# Patient Record
Sex: Male | Born: 1950 | Race: White | Hispanic: No | Marital: Married | Smoking: Current every day smoker
Health system: Southern US, Community
[De-identification: ages and names within clinical notes are randomized; demographics above are authoritative.]

## PROBLEM LIST (undated history)

## (undated) DIAGNOSIS — E785 Hyperlipidemia, unspecified: Secondary | ICD-10-CM

## (undated) DIAGNOSIS — I10 Essential (primary) hypertension: Secondary | ICD-10-CM

## (undated) HISTORY — PX: JOINT REPLACEMENT: SHX530

## (undated) HISTORY — PX: BACK SURGERY: SHX140

---

## 1999-10-09 ENCOUNTER — Encounter: Payer: Self-pay | Admitting: Orthopedic Surgery

## 1999-10-15 ENCOUNTER — Ambulatory Visit (HOSPITAL_COMMUNITY): Admission: RE | Admit: 1999-10-15 | Discharge: 1999-10-16 | Payer: Self-pay | Admitting: Orthopedic Surgery

## 1999-10-15 ENCOUNTER — Encounter: Payer: Self-pay | Admitting: Orthopedic Surgery

## 2000-05-28 ENCOUNTER — Encounter: Payer: Self-pay | Admitting: Orthopedic Surgery

## 2000-05-28 ENCOUNTER — Inpatient Hospital Stay (HOSPITAL_COMMUNITY): Admission: RE | Admit: 2000-05-28 | Discharge: 2000-05-31 | Payer: Self-pay | Admitting: Orthopedic Surgery

## 2002-10-31 ENCOUNTER — Ambulatory Visit (HOSPITAL_COMMUNITY): Admission: RE | Admit: 2002-10-31 | Discharge: 2002-10-31 | Payer: Self-pay | Admitting: Orthopedic Surgery

## 2002-10-31 ENCOUNTER — Encounter: Payer: Self-pay | Admitting: Orthopedic Surgery

## 2002-11-14 ENCOUNTER — Inpatient Hospital Stay (HOSPITAL_COMMUNITY): Admission: RE | Admit: 2002-11-14 | Discharge: 2002-11-17 | Payer: Self-pay | Admitting: Orthopedic Surgery

## 2002-11-14 ENCOUNTER — Encounter: Payer: Self-pay | Admitting: Orthopedic Surgery

## 2003-10-12 ENCOUNTER — Ambulatory Visit (HOSPITAL_COMMUNITY): Admission: RE | Admit: 2003-10-12 | Discharge: 2003-10-12 | Payer: Self-pay | Admitting: Gastroenterology

## 2016-11-08 ENCOUNTER — Inpatient Hospital Stay (HOSPITAL_COMMUNITY): Payer: Managed Care, Other (non HMO)

## 2016-11-08 ENCOUNTER — Inpatient Hospital Stay (HOSPITAL_COMMUNITY)
Admission: AD | Admit: 2016-11-08 | Discharge: 2016-11-14 | DRG: 480 | Disposition: A | Discharge status: Home Health | Payer: Managed Care, Other (non HMO) | Source: Other Acute Inpatient Hospital | Attending: Orthopedic Surgery | Admitting: Orthopedic Surgery

## 2016-11-08 ENCOUNTER — Encounter (HOSPITAL_COMMUNITY): Payer: Self-pay

## 2016-11-08 DIAGNOSIS — F17210 Nicotine dependence, cigarettes, uncomplicated: Secondary | ICD-10-CM | POA: Diagnosis present

## 2016-11-08 DIAGNOSIS — M9702XA Periprosthetic fracture around internal prosthetic left hip joint, initial encounter: Secondary | ICD-10-CM

## 2016-11-08 DIAGNOSIS — S72302A Unspecified fracture of shaft of left femur, initial encounter for closed fracture: Secondary | ICD-10-CM | POA: Diagnosis not present

## 2016-11-08 DIAGNOSIS — Z419 Encounter for procedure for purposes other than remedying health state, unspecified: Secondary | ICD-10-CM

## 2016-11-08 DIAGNOSIS — M978XXA Periprosthetic fracture around other internal prosthetic joint, initial encounter: Secondary | ICD-10-CM

## 2016-11-08 DIAGNOSIS — I10 Essential (primary) hypertension: Secondary | ICD-10-CM | POA: Diagnosis not present

## 2016-11-08 DIAGNOSIS — Z96649 Presence of unspecified artificial hip joint: Secondary | ICD-10-CM

## 2016-11-08 DIAGNOSIS — Z96642 Presence of left artificial hip joint: Secondary | ICD-10-CM | POA: Diagnosis present

## 2016-11-08 DIAGNOSIS — Y92008 Other place in unspecified non-institutional (private) residence as the place of occurrence of the external cause: Secondary | ICD-10-CM

## 2016-11-08 DIAGNOSIS — Z79899 Other long term (current) drug therapy: Secondary | ICD-10-CM | POA: Diagnosis not present

## 2016-11-08 DIAGNOSIS — Z7982 Long term (current) use of aspirin: Secondary | ICD-10-CM

## 2016-11-08 DIAGNOSIS — D62 Acute posthemorrhagic anemia: Secondary | ICD-10-CM | POA: Diagnosis not present

## 2016-11-08 DIAGNOSIS — M79652 Pain in left thigh: Secondary | ICD-10-CM | POA: Diagnosis present

## 2016-11-08 DIAGNOSIS — W010XXA Fall on same level from slipping, tripping and stumbling without subsequent striking against object, initial encounter: Secondary | ICD-10-CM | POA: Diagnosis not present

## 2016-11-08 DIAGNOSIS — M1710 Unilateral primary osteoarthritis, unspecified knee: Secondary | ICD-10-CM | POA: Diagnosis present

## 2016-11-08 HISTORY — DX: Essential (primary) hypertension: I10

## 2016-11-08 LAB — COMPREHENSIVE METABOLIC PANEL
ALBUMIN: 3.5 g/dL (ref 3.5–5.0)
ALT: 10 U/L — ABNORMAL LOW (ref 17–63)
AST: 24 U/L (ref 15–41)
Alkaline Phosphatase: 39 U/L (ref 38–126)
Anion gap: 8 (ref 5–15)
BILIRUBIN TOTAL: 0.8 mg/dL (ref 0.3–1.2)
BUN: 21 mg/dL — AB (ref 6–20)
CO2: 24 mmol/L (ref 22–32)
Calcium: 8.9 mg/dL (ref 8.9–10.3)
Chloride: 99 mmol/L — ABNORMAL LOW (ref 101–111)
Creatinine, Ser: 1.22 mg/dL (ref 0.61–1.24)
GFR calc Af Amer: >60 mL/min (ref >60)
GFR calc non Af Amer: >60 mL/min (ref >60)
GLUCOSE: 171 mg/dL — AB (ref 65–99)
POTASSIUM: 4.2 mmol/L (ref 3.5–5.1)
SODIUM: 131 mmol/L — AB (ref 135–145)
TOTAL PROTEIN: 6.2 g/dL — AB (ref 6.5–8.1)

## 2016-11-08 LAB — CBC
HEMATOCRIT: 35.9 % — AB (ref 39.0–52.0)
HEMOGLOBIN: 12.2 g/dL — AB (ref 13.0–17.0)
MCH: 32 pg (ref 26.0–34.0)
MCHC: 34 g/dL (ref 30.0–36.0)
MCV: 94.2 fL (ref 78.0–100.0)
Platelets: 207 10*3/uL (ref 150–400)
RBC: 3.81 MIL/uL — ABNORMAL LOW (ref 4.22–5.81)
RDW: 12.4 % (ref 11.5–15.5)
WBC: 13 10*3/uL — AB (ref 4.0–10.5)

## 2016-11-08 MED ORDER — OXYCODONE HCL 5 MG PO TABS
5.0000 mg | ORAL_TABLET | ORAL | Status: DC | PRN
  Administered 2016-11-08 – 2016-11-14 (×26): 10 mg via ORAL
  Filled 2016-11-08 (×28): qty 2

## 2016-11-08 MED ORDER — DOCUSATE SODIUM 100 MG PO CAPS
100.0000 mg | ORAL_CAPSULE | Freq: Two times a day (BID) | ORAL | Status: DC
  Administered 2016-11-08 – 2016-11-13 (×9): 100 mg via ORAL
  Filled 2016-11-08 (×10): qty 1

## 2016-11-08 MED ORDER — ONDANSETRON HCL 4 MG/2ML IJ SOLN: 4.0000 mg | Freq: Four times a day (QID) | INTRAMUSCULAR | Status: DC | PRN

## 2016-11-08 MED ORDER — HYDROCODONE-ACETAMINOPHEN 7.5-325 MG PO TABS
1.0000 | ORAL_TABLET | Freq: Four times a day (QID) | ORAL | Status: DC
  Administered 2016-11-08 – 2016-11-10 (×6): 1 via ORAL
  Filled 2016-11-08 (×6): qty 1

## 2016-11-08 MED ORDER — ZOLPIDEM TARTRATE 5 MG PO TABS
5.0000 mg | ORAL_TABLET | Freq: Every evening | ORAL | Status: DC | PRN
  Administered 2016-11-10 – 2016-11-13 (×3): 5 mg via ORAL
  Filled 2016-11-08 (×3): qty 1

## 2016-11-08 MED ORDER — FLEET ENEMA 7-19 GM/118ML RE ENEM: 1.0000 | ENEMA | Freq: Once | RECTAL | Status: DC | PRN

## 2016-11-08 MED ORDER — ONDANSETRON HCL 4 MG PO TABS: 4.0000 mg | ORAL_TABLET | Freq: Four times a day (QID) | ORAL | Status: DC | PRN

## 2016-11-08 MED ORDER — HYDROMORPHONE HCL 1 MG/ML IJ SOLN
1.0000 mg | INTRAMUSCULAR | Status: DC | PRN
  Filled 2016-11-08: qty 1

## 2016-11-08 MED ORDER — ACETAMINOPHEN 500 MG PO TABS
1000.0000 mg | ORAL_TABLET | Freq: Four times a day (QID) | ORAL | Status: DC
  Administered 2016-11-08 – 2016-11-09 (×4): 1000 mg via ORAL
  Filled 2016-11-08 (×4): qty 2

## 2016-11-08 MED ORDER — ACETAMINOPHEN 325 MG PO TABS: 650.0000 mg | ORAL_TABLET | Freq: Four times a day (QID) | ORAL | Status: DC | PRN

## 2016-11-08 MED ORDER — SENNOSIDES-DOCUSATE SODIUM 8.6-50 MG PO TABS: 1.0000 | ORAL_TABLET | Freq: Every evening | ORAL | Status: DC | PRN

## 2016-11-08 MED ORDER — DIPHENHYDRAMINE HCL 12.5 MG/5ML PO ELIX: 12.5000 mg | ORAL_SOLUTION | ORAL | Status: DC | PRN

## 2016-11-08 MED ORDER — METHOCARBAMOL 1000 MG/10ML IJ SOLN
500.0000 mg | Freq: Four times a day (QID) | INTRAVENOUS | Status: DC | PRN
  Filled 2016-11-08: qty 5

## 2016-11-08 MED ORDER — ACETAMINOPHEN 650 MG RE SUPP: 650.0000 mg | Freq: Four times a day (QID) | RECTAL | Status: DC | PRN

## 2016-11-08 MED ORDER — BISACODYL 5 MG PO TBEC: 5.0000 mg | DELAYED_RELEASE_TABLET | Freq: Every day | ORAL | Status: DC | PRN

## 2016-11-08 MED ORDER — METHOCARBAMOL 500 MG PO TABS
500.0000 mg | ORAL_TABLET | Freq: Four times a day (QID) | ORAL | Status: DC | PRN
  Administered 2016-11-08 – 2016-11-14 (×16): 500 mg via ORAL
  Filled 2016-11-08 (×18): qty 1

## 2016-11-09 ENCOUNTER — Inpatient Hospital Stay (HOSPITAL_COMMUNITY): Payer: Managed Care, Other (non HMO)

## 2016-11-09 LAB — SURGICAL PCR SCREEN
MRSA, PCR: NEGATIVE
STAPHYLOCOCCUS AUREUS: NEGATIVE

## 2016-11-09 LAB — HIV ANTIBODY (ROUTINE TESTING W REFLEX): HIV SCREEN 4TH GENERATION: NONREACTIVE

## 2016-11-09 MED ORDER — LISINOPRIL 20 MG PO TABS
20.0000 mg | ORAL_TABLET | Freq: Every day | ORAL | Status: DC
  Administered 2016-11-09 – 2016-11-10 (×2): 20 mg via ORAL
  Filled 2016-11-09 (×2): qty 1

## 2016-11-09 MED ORDER — HYDROCHLOROTHIAZIDE 25 MG PO TABS
25.0000 mg | ORAL_TABLET | Freq: Every day | ORAL | Status: DC
  Administered 2016-11-09 – 2016-11-10 (×2): 25 mg via ORAL
  Filled 2016-11-09 (×2): qty 1

## 2016-11-09 NOTE — H&P (Addendum)
PREOPERATIVE H&P  Chief Complaint: Left leg pain  HPI: Dennis Smith is a 66 y.o. male who was transferred last night for management of a periprosthetic left femur fracture. I believe he was seen at an urgent care, Dr. Valentina Gu received a call. He is a patient of mine, who has had a total hip replacement performed by Dr. Thurston Hole in 2004. He was doing very well as far as his leg goes, although he has knee arthritis as well. He was up on a roof doing some work and slipped on the wet roof, and fell, thankfully did not fall off the roof, but did fall onto his side. Acute onset severe pain unable to walk. Pain is better at rest, as well as with pain medications, worse with movement, located diffusely around the left thigh.  Past Medical History:  Diagnosis Date  . Hypertension    Past Surgical History:  Procedure Laterality Date  . BACK SURGERY    . JOINT REPLACEMENT     Social History   Social History  . Marital status: Married    Spouse name: N/A  . Number of children: N/A  . Years of education: N/A   Social History Main Topics  . Smoking status: Current Every Day Smoker    Packs/day: 1.00    Years: 40.00    Types: Cigarettes  . Smokeless tobacco: Never Used  . Alcohol use 4.8 oz/week    8 Glasses of wine per week  . Drug use: No  . Sexual activity: Yes   Other Topics Concern  . None   Social History Narrative  . None   No family history on file. No Known Allergies Prior to Admission medications   Medication Sig Start Date End Date Taking? Authorizing Provider  amLODipine (NORVASC) 10 MG tablet Take 10 mg by mouth at bedtime. 09/15/16  Yes [provider]  Ascorbic Acid (VITAMIN C PO) Take 1 tablet by mouth every other day.   Yes [provider]  aspirin EC 81 MG tablet Take 81 mg by mouth at bedtime.   Yes [provider]  cetirizine (ZYRTEC) 10 MG tablet Take 10 mg by mouth daily.   Yes [provider]  CINNAMON PO Take 1 capsule by mouth  daily.   Yes [provider]  Cyanocobalamin (VITAMIN B-12 PO) Take 1 tablet by mouth daily.   Yes [provider]  hydrochlorothiazide (HYDRODIURIL) 25 MG tablet Take 25 mg by mouth every morning.  09/15/16  Yes [provider]  ibuprofen (ADVIL,MOTRIN) 200 MG tablet Take 800 mg by mouth every morning.   Yes [provider]  lisinopril (PRINIVIL,ZESTRIL) 20 MG tablet Take 20 mg by mouth every morning.  09/15/16  Yes [provider]  Omega-3 Fatty Acids (FISH OIL PO) Take 1 capsule by mouth daily with supper.   Yes [provider]  omeprazole (PRILOSEC) 20 MG capsule Take 20 mg by mouth daily before breakfast. 10/17/15  Yes [provider]  TURMERIC PO Take 1 capsule by mouth daily.   Yes [provider]     Positive ROS: All other systems have been reviewed and were otherwise negative with the exception of those mentioned in the HPI and as above.  Physical Exam: General: Alert, no acute distress Cardiovascular: No pedal edema Respiratory: No cyanosis, no use of accessory musculature GI: No organomegaly, abdomen is soft and non-tender Skin: No lesions in the area of chief complaint Neurologic: Sensation intact distally Psychiatric: Patient is competent  for consent with normal mood and affect Lymphatic: No axillary or cervical lymphadenopathy  MUSCULOSKELETAL: Left leg has bruising, positive pain to palpation diffusely around the left thigh, EHL and FHL are intact. I did not assess leg lengths, as I did not fully straighten out his leg.  Assessment: Left hip periprosthetic femur fracture   Plan: Plan for Procedure(s): This is an acute severe injury, and is complicated and requires the attention of a total joint subspecialists. I have consulted Dr. Turner Danielsowan, who is going to plan for open reduction internal fixation versus long stem revision replacement of the left femoral component, the plan is for Wednesday.  He will be  admitted, NWB, with IV pain control and perioperative optimization in the meantime.  I'll also make sure that he has a type and cross available for surgical intervention. I have discussed this with the patient, and the potential for blood transfusion.  Eulas PostLANDAU,Allene Furuya P, MD Cell [phone]   11/09/2016 3:14 PM

## 2016-11-10 LAB — PREPARE RBC (CROSSMATCH)

## 2016-11-10 LAB — ABO/RH: ABO/RH(D): B POS

## 2016-11-10 MED ORDER — POVIDONE-IODINE 10 % EX SWAB: 2.0000 "application " | Freq: Once | CUTANEOUS | Status: DC

## 2016-11-10 MED ORDER — TRANEXAMIC ACID 1000 MG/10ML IV SOLN
1000.0000 mg | INTRAVENOUS | Status: AC
  Administered 2016-11-11: 1000 mg via INTRAVENOUS
  Filled 2016-11-10: qty 1100

## 2016-11-10 MED ORDER — TRANEXAMIC ACID 1000 MG/10ML IV SOLN
2000.0000 mg | INTRAVENOUS | Status: DC
  Filled 2016-11-10 (×2): qty 20

## 2016-11-10 MED ORDER — CEFAZOLIN SODIUM-DEXTROSE 2-4 GM/100ML-% IV SOLN: 2.0000 g | INTRAVENOUS | Status: DC

## 2016-11-10 MED ORDER — CEFAZOLIN SODIUM-DEXTROSE 2-4 GM/100ML-% IV SOLN
2.0000 g | INTRAVENOUS | Status: AC
  Administered 2016-11-11: 2 g via INTRAVENOUS
  Filled 2016-11-10 (×2): qty 100

## 2016-11-10 MED ORDER — TRANEXAMIC ACID 1000 MG/10ML IV SOLN
2000.0000 mg | INTRAVENOUS | Status: DC
  Filled 2016-11-10: qty 20

## 2016-11-10 MED ORDER — BUPIVACAINE LIPOSOME 1.3 % IJ SUSP: 20.0000 mL | Freq: Once | INTRAMUSCULAR | Status: DC

## 2016-11-10 MED ORDER — TRANEXAMIC ACID 1000 MG/10ML IV SOLN: 2000.0000 mg | Freq: Once | INTRAVENOUS | Status: DC

## 2016-11-10 MED ORDER — CHLORHEXIDINE GLUCONATE 4 % EX LIQD: 60.0000 mL | Freq: Once | CUTANEOUS | Status: AC

## 2016-11-10 MED ORDER — CHLORHEXIDINE GLUCONATE 4 % EX LIQD
60.0000 mL | Freq: Once | CUTANEOUS | Status: AC
  Administered 2016-11-10: 4 via TOPICAL
  Filled 2016-11-10: qty 60
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
## 2016-11-11 ENCOUNTER — Encounter (HOSPITAL_COMMUNITY): Admission: AD | Disposition: A | Discharge status: Home Health | Payer: Self-pay | Attending: Orthopedic Surgery

## 2016-11-11 ENCOUNTER — Inpatient Hospital Stay (HOSPITAL_COMMUNITY): Payer: Managed Care, Other (non HMO)

## 2016-11-11 ENCOUNTER — Encounter (HOSPITAL_COMMUNITY): Payer: Self-pay | Admitting: *Deleted

## 2016-11-11 ENCOUNTER — Inpatient Hospital Stay (HOSPITAL_COMMUNITY): Payer: Managed Care, Other (non HMO) | Admitting: Anesthesiology

## 2016-11-11 HISTORY — PX: ORIF FEMUR FRACTURE: SHX2119

## 2016-11-11 SURGERY — OPEN REDUCTION INTERNAL FIXATION (ORIF) DISTAL FEMUR FRACTURE
Anesthesia: General | Site: Hip | Laterality: Left

## 2016-11-11 MED ORDER — ONDANSETRON HCL 4 MG/2ML IJ SOLN: 4.0000 mg | Freq: Once | INTRAMUSCULAR | Status: DC | PRN

## 2016-11-11 MED ORDER — ROCURONIUM BROMIDE 50 MG/5ML IV SOLN
INTRAVENOUS | Status: AC
  Filled 2016-11-11: qty 2

## 2016-11-11 MED ORDER — PROPOFOL 10 MG/ML IV BOLUS
INTRAVENOUS | Status: AC
  Filled 2016-11-11: qty 40

## 2016-11-11 MED ORDER — MEPERIDINE HCL 25 MG/ML IJ SOLN: 6.2500 mg | INTRAMUSCULAR | Status: DC | PRN

## 2016-11-11 MED ORDER — FENTANYL CITRATE (PF) 100 MCG/2ML IJ SOLN: 25.0000 ug | INTRAMUSCULAR | Status: DC | PRN

## 2016-11-11 MED ORDER — SODIUM CHLORIDE 0.9 % IR SOLN
Status: DC | PRN
  Administered 2016-11-11 (×2): 1000 mL

## 2016-11-11 MED ORDER — ONDANSETRON HCL 4 MG/2ML IJ SOLN: 4.0000 mg | Freq: Four times a day (QID) | INTRAMUSCULAR | Status: DC | PRN

## 2016-11-11 MED ORDER — FENTANYL CITRATE (PF) 250 MCG/5ML IJ SOLN
INTRAMUSCULAR | Status: AC
  Filled 2016-11-11: qty 5

## 2016-11-11 MED ORDER — PHENOL 1.4 % MT LIQD: 1.0000 | OROMUCOSAL | Status: DC | PRN

## 2016-11-11 MED ORDER — OXYCODONE-ACETAMINOPHEN 5-325 MG PO TABS: 1.0000 | ORAL_TABLET | ORAL | 0 refills | Status: DC | PRN

## 2016-11-11 MED ORDER — DEXAMETHASONE SODIUM PHOSPHATE 10 MG/ML IJ SOLN
INTRAMUSCULAR | Status: DC | PRN
  Administered 2016-11-11: 5 mg via INTRAVENOUS

## 2016-11-11 MED ORDER — EPHEDRINE 5 MG/ML INJ
INTRAVENOUS | Status: AC
  Filled 2016-11-11: qty 20

## 2016-11-11 MED ORDER — AMLODIPINE BESYLATE 10 MG PO TABS
10.0000 mg | ORAL_TABLET | Freq: Every day | ORAL | Status: DC
  Administered 2016-11-11 – 2016-11-12 (×2): 10 mg via ORAL
  Filled 2016-11-11 (×3): qty 1

## 2016-11-11 MED ORDER — LABETALOL HCL 5 MG/ML IV SOLN
INTRAVENOUS | Status: AC
  Administered 2016-11-11: 10 mg via INTRAVENOUS
  Filled 2016-11-11: qty 4

## 2016-11-11 MED ORDER — PHENYLEPHRINE 40 MCG/ML (10ML) SYRINGE FOR IV PUSH (FOR BLOOD PRESSURE SUPPORT)
PREFILLED_SYRINGE | INTRAVENOUS | Status: AC
  Filled 2016-11-11: qty 20

## 2016-11-11 MED ORDER — LORATADINE 10 MG PO TABS
10.0000 mg | ORAL_TABLET | Freq: Every day | ORAL | Status: DC
  Administered 2016-11-12 – 2016-11-14 (×3): 10 mg via ORAL
  Filled 2016-11-11 (×3): qty 1

## 2016-11-11 MED ORDER — DEXAMETHASONE SODIUM PHOSPHATE 10 MG/ML IJ SOLN
INTRAMUSCULAR | Status: AC
  Filled 2016-11-11: qty 1

## 2016-11-11 MED ORDER — FENTANYL CITRATE (PF) 100 MCG/2ML IJ SOLN
INTRAMUSCULAR | Status: AC
  Administered 2016-11-11: 50 ug via INTRAVENOUS
  Filled 2016-11-11: qty 2

## 2016-11-11 MED ORDER — METHOCARBAMOL 500 MG PO TABS: 500.0000 mg | ORAL_TABLET | Freq: Two times a day (BID) | ORAL | 0 refills | Status: DC

## 2016-11-11 MED ORDER — LISINOPRIL 20 MG PO TABS
20.0000 mg | ORAL_TABLET | Freq: Every morning | ORAL | Status: DC
  Administered 2016-11-12 – 2016-11-13 (×2): 20 mg via ORAL
  Filled 2016-11-11 (×2): qty 1

## 2016-11-11 MED ORDER — LACTATED RINGERS IV SOLN
INTRAVENOUS | Status: DC
  Administered 2016-11-11 (×2): via INTRAVENOUS

## 2016-11-11 MED ORDER — FENTANYL CITRATE (PF) 100 MCG/2ML IJ SOLN
INTRAMUSCULAR | Status: DC | PRN
  Administered 2016-11-11 (×6): 50 ug via INTRAVENOUS
  Administered 2016-11-11: 100 ug via INTRAVENOUS
  Administered 2016-11-11 (×7): 50 ug via INTRAVENOUS

## 2016-11-11 MED ORDER — PROPOFOL 10 MG/ML IV BOLUS
INTRAVENOUS | Status: DC | PRN
  Administered 2016-11-11 (×2): 20 mg via INTRAVENOUS
  Administered 2016-11-11: 160 mg via INTRAVENOUS

## 2016-11-11 MED ORDER — BUPIVACAINE-EPINEPHRINE 0.5% -1:200000 IJ SOLN
INTRAMUSCULAR | Status: DC | PRN
  Administered 2016-11-11: 30 mL

## 2016-11-11 MED ORDER — HYDROCHLOROTHIAZIDE 25 MG PO TABS
25.0000 mg | ORAL_TABLET | Freq: Every morning | ORAL | Status: DC
  Administered 2016-11-12 – 2016-11-13 (×2): 25 mg via ORAL
  Filled 2016-11-11 (×2): qty 1

## 2016-11-11 MED ORDER — PANTOPRAZOLE SODIUM 40 MG PO TBEC
40.0000 mg | DELAYED_RELEASE_TABLET | Freq: Every day | ORAL | Status: DC
  Administered 2016-11-11 – 2016-11-14 (×4): 40 mg via ORAL
  Filled 2016-11-11 (×4): qty 1

## 2016-11-11 MED ORDER — ONDANSETRON HCL 4 MG PO TABS: 4.0000 mg | ORAL_TABLET | Freq: Four times a day (QID) | ORAL | Status: DC | PRN

## 2016-11-11 MED ORDER — ONDANSETRON HCL 4 MG/2ML IJ SOLN
INTRAMUSCULAR | Status: AC
  Filled 2016-11-11: qty 4

## 2016-11-11 MED ORDER — PHENYLEPHRINE 40 MCG/ML (10ML) SYRINGE FOR IV PUSH (FOR BLOOD PRESSURE SUPPORT)
PREFILLED_SYRINGE | INTRAVENOUS | Status: DC | PRN
  Administered 2016-11-11 (×2): 80 ug via INTRAVENOUS
  Administered 2016-11-11: 120 ug via INTRAVENOUS

## 2016-11-11 MED ORDER — TRANEXAMIC ACID 1000 MG/10ML IV SOLN
INTRAVENOUS | Status: AC | PRN
  Administered 2016-11-11: 2000 mg via INTRAVENOUS

## 2016-11-11 MED ORDER — ROCURONIUM BROMIDE 100 MG/10ML IV SOLN
INTRAVENOUS | Status: DC | PRN
  Administered 2016-11-11: 50 mg via INTRAVENOUS

## 2016-11-11 MED ORDER — FENTANYL CITRATE (PF) 100 MCG/2ML IJ SOLN
25.0000 ug | INTRAMUSCULAR | Status: DC | PRN
  Administered 2016-11-11 (×2): 50 ug via INTRAVENOUS

## 2016-11-11 MED ORDER — MIDAZOLAM HCL 2 MG/2ML IJ SOLN
INTRAMUSCULAR | Status: AC
  Filled 2016-11-11: qty 2

## 2016-11-11 MED ORDER — METOCLOPRAMIDE HCL 5 MG PO TABS: 5.0000 mg | ORAL_TABLET | Freq: Three times a day (TID) | ORAL | Status: DC | PRN

## 2016-11-11 MED ORDER — LABETALOL HCL 5 MG/ML IV SOLN
10.0000 mg | INTRAVENOUS | Status: AC | PRN
  Administered 2016-11-11 (×2): 10 mg via INTRAVENOUS

## 2016-11-11 MED ORDER — ASPIRIN EC 325 MG PO TBEC: 325.0000 mg | DELAYED_RELEASE_TABLET | Freq: Two times a day (BID) | ORAL | 0 refills | Status: DC

## 2016-11-11 MED ORDER — METOCLOPRAMIDE HCL 5 MG/ML IJ SOLN: 5.0000 mg | Freq: Three times a day (TID) | INTRAMUSCULAR | Status: DC | PRN

## 2016-11-11 MED ORDER — ONDANSETRON HCL 4 MG/2ML IJ SOLN
INTRAMUSCULAR | Status: DC | PRN
  Administered 2016-11-11: 4 mg via INTRAVENOUS

## 2016-11-11 MED ORDER — MENTHOL 3 MG MT LOZG: 1.0000 | LOZENGE | OROMUCOSAL | Status: DC | PRN

## 2016-11-11 MED ORDER — ACETAMINOPHEN 325 MG PO TABS
650.0000 mg | ORAL_TABLET | Freq: Four times a day (QID) | ORAL | Status: DC | PRN
  Administered 2016-11-12 – 2016-11-14 (×4): 650 mg via ORAL
  Filled 2016-11-11 (×4): qty 2

## 2016-11-11 MED ORDER — LIDOCAINE HCL (CARDIAC) 20 MG/ML IV SOLN
INTRAVENOUS | Status: DC | PRN
  Administered 2016-11-11: 60 mg via INTRAVENOUS

## 2016-11-11 MED ORDER — LIDOCAINE HCL (CARDIAC) 20 MG/ML IV SOLN
INTRAVENOUS | Status: AC
  Filled 2016-11-11: qty 10

## 2016-11-11 MED ORDER — ASPIRIN EC 325 MG PO TBEC
325.0000 mg | DELAYED_RELEASE_TABLET | Freq: Two times a day (BID) | ORAL | Status: DC
  Administered 2016-11-12 – 2016-11-14 (×5): 325 mg via ORAL
  Filled 2016-11-11 (×5): qty 1

## 2016-11-11 MED ORDER — KCL IN DEXTROSE-NACL 20-5-0.45 MEQ/L-%-% IV SOLN
INTRAVENOUS | Status: DC
  Administered 2016-11-11: 22:00:00 via INTRAVENOUS
  Filled 2016-11-11: qty 1000

## 2016-11-11 MED ORDER — ACETAMINOPHEN 650 MG RE SUPP: 650.0000 mg | Freq: Four times a day (QID) | RECTAL | Status: DC | PRN

## 2016-11-11 MED ORDER — BUPIVACAINE LIPOSOME 1.3 % IJ SUSP
20.0000 mL | INTRAMUSCULAR | Status: AC
  Administered 2016-11-11: 20 mL
  Filled 2016-11-11: qty 20

## 2016-11-11 MED ORDER — ONDANSETRON HCL 4 MG/2ML IJ SOLN
INTRAMUSCULAR | Status: AC
  Filled 2016-11-11: qty 2

## 2016-11-11 MED ORDER — MIDAZOLAM HCL 5 MG/5ML IJ SOLN
INTRAMUSCULAR | Status: DC | PRN
  Administered 2016-11-11: 2 mg via INTRAVENOUS

## 2016-11-11 MED ORDER — SODIUM CHLORIDE 0.9 % IJ SOLN
INTRAMUSCULAR | Status: DC | PRN
  Administered 2016-11-11: 50 mL via INTRAVENOUS

## 2016-11-11 MED ORDER — BUPIVACAINE HCL (PF) 0.25 % IJ SOLN
INTRAMUSCULAR | Status: AC
  Filled 2016-11-11: qty 30

## 2016-11-11 SURGICAL SUPPLY — 99 items
BAG DECANTER FOR FLEXI CONT (MISCELLANEOUS) ×3 IMPLANT
BIT DRILL 4.3 (BIT) ×2 IMPLANT
BLADE SAW SAG 73X25 THK (BLADE) ×1
BLADE SAW SGTL 73X25 THK (BLADE) ×2 IMPLANT
BLADE SURG 10 STRL SS (BLADE) ×6 IMPLANT
BNDG COHESIVE 4X5 TAN STRL (GAUZE/BANDAGES/DRESSINGS) ×3 IMPLANT
BRUSH FEMORAL CANAL (MISCELLANEOUS) IMPLANT
CABLE CERLAGE W/CRIMP 1.8 (Cable) ×12 IMPLANT
CAP LOCK NCB (Cap) ×6 IMPLANT
COVER SURGICAL LIGHT HANDLE (MISCELLANEOUS) ×3 IMPLANT
CUFF TOURNIQUET SINGLE 34IN LL (TOURNIQUET CUFF) IMPLANT
CUFF TOURNIQUET SINGLE 44IN (TOURNIQUET CUFF) IMPLANT
DRAPE C-ARM 42X72 X-RAY (DRAPES) ×3 IMPLANT
DRAPE C-ARMOR (DRAPES) ×3 IMPLANT
DRAPE HALF SHEET 40X57 (DRAPES) ×3 IMPLANT
DRAPE IMP U-DRAPE 54X76 (DRAPES) ×3 IMPLANT
DRAPE ORTHO SPLIT 77X108 STRL (DRAPES) ×6
DRAPE SURG 17X23 STRL (DRAPES) IMPLANT
DRAPE SURG ORHT 6 SPLT 77X108 (DRAPES) ×4 IMPLANT
DRAPE U-SHAPE 47X51 STRL (DRAPES) ×3 IMPLANT
DRILL BIT 4.3 (BIT) ×2
DRILL BIT 7/64X5 (BIT) ×3 IMPLANT
DRSG ADAPTIC 3X8 NADH LF (GAUZE/BANDAGES/DRESSINGS) IMPLANT
DRSG AQUACEL AG ADV 3.5X 6 (GAUZE/BANDAGES/DRESSINGS) ×3 IMPLANT
DRSG AQUACEL AG ADV 3.5X10 (GAUZE/BANDAGES/DRESSINGS) IMPLANT
DRSG AQUACEL AG ADV 3.5X14 (GAUZE/BANDAGES/DRESSINGS) ×3 IMPLANT
DRSG PAD ABDOMINAL 8X10 ST (GAUZE/BANDAGES/DRESSINGS) IMPLANT
DURAPREP 26ML APPLICATOR (WOUND CARE) ×3 IMPLANT
ELECT BLADE 4.0 EZ CLEAN MEGAD (MISCELLANEOUS) ×3
ELECT BLADE 6.5 EXT (BLADE) IMPLANT
ELECT CAUTERY BLADE 6.4 (BLADE) ×3 IMPLANT
ELECT REM PT RETURN 9FT ADLT (ELECTROSURGICAL) ×3
ELECTRODE BLDE 4.0 EZ CLN MEGD (MISCELLANEOUS) ×2 IMPLANT
ELECTRODE REM PT RTRN 9FT ADLT (ELECTROSURGICAL) ×2 IMPLANT
EVACUATOR 1/8 PVC DRAIN (DRAIN) IMPLANT
GAUZE SPONGE 4X4 12PLY STRL (GAUZE/BANDAGES/DRESSINGS) IMPLANT
GAUZE XEROFORM 5X9 LF (GAUZE/BANDAGES/DRESSINGS) IMPLANT
GLOVE BIO SURGEON STRL SZ 6.5 (GLOVE) ×3 IMPLANT
GLOVE BIO SURGEON STRL SZ7.5 (GLOVE) ×3 IMPLANT
GLOVE BIO SURGEON STRL SZ8.5 (GLOVE) ×6 IMPLANT
GLOVE BIOGEL PI IND STRL 8 (GLOVE) ×4 IMPLANT
GLOVE BIOGEL PI IND STRL 9 (GLOVE) ×2 IMPLANT
GLOVE BIOGEL PI INDICATOR 8 (GLOVE) ×2
GLOVE BIOGEL PI INDICATOR 9 (GLOVE) ×1
GLOVE SURG SS PI 6.5 STRL IVOR (GLOVE) ×3 IMPLANT
GOWN STRL REUS W/ TWL LRG LVL3 (GOWN DISPOSABLE) ×6 IMPLANT
GOWN STRL REUS W/ TWL XL LVL3 (GOWN DISPOSABLE) ×4 IMPLANT
GOWN STRL REUS W/TWL LRG LVL3 (GOWN DISPOSABLE) ×6
GOWN STRL REUS W/TWL XL LVL3 (GOWN DISPOSABLE) ×4
HANDPIECE INTERPULSE COAX TIP (DISPOSABLE)
HOOD PEEL AWAY FACE SHEILD DIS (HOOD) ×6 IMPLANT
KIT BASIN OR (CUSTOM PROCEDURE TRAY) ×3 IMPLANT
KIT ROOM TURNOVER OR (KITS) ×3 IMPLANT
LOCKPLATE CABLE BUTTON NCP HIP (Orthopedic Implant) ×12 IMPLANT
MANIFOLD NEPTUNE II (INSTRUMENTS) ×3 IMPLANT
NEEDLE 22X1 1/2 (OR ONLY) (NEEDLE) ×6 IMPLANT
NS IRRIG 1000ML POUR BTL (IV SOLUTION) ×6 IMPLANT
PACK GENERAL/GYN (CUSTOM PROCEDURE TRAY) IMPLANT
PACK TOTAL JOINT (CUSTOM PROCEDURE TRAY) ×3 IMPLANT
PACK UNIVERSAL I (CUSTOM PROCEDURE TRAY) IMPLANT
PAD ARMBOARD 7.5X6 YLW CONV (MISCELLANEOUS) ×6 IMPLANT
PASSER SUT SWANSON 36MM LOOP (INSTRUMENTS) IMPLANT
PLATE LT PROX FEMUR 12H (Plate) ×3 IMPLANT
PRESSURIZER FEMORAL UNIV (MISCELLANEOUS) IMPLANT
SCREW CORTICAL NCB 5.0X40 (Screw) ×3 IMPLANT
SCREW NCB 5.0X36MM (Screw) ×9 IMPLANT
SCREW NCB 5.0X46MM (Screw) ×3 IMPLANT
SCREW UNICORT 5.0X18MM (Screw) ×3 IMPLANT
SCREW UNICORTICAL 5.0X18 (Screw) ×2 IMPLANT
SET HNDPC FAN SPRY TIP SCT (DISPOSABLE) IMPLANT
SLEEVE SURGEON STRL (DRAPES) IMPLANT
SPONGE LAP 18X18 X RAY DECT (DISPOSABLE) IMPLANT
STAPLER VISISTAT 35W (STAPLE) ×3 IMPLANT
STOCKINETTE IMPERVIOUS 9X36 MD (GAUZE/BANDAGES/DRESSINGS) ×3 IMPLANT
SUCTION FRAZIER HANDLE 10FR (MISCELLANEOUS) ×1
SUCTION TUBE FRAZIER 10FR DISP (MISCELLANEOUS) ×2 IMPLANT
SUT ETHIBOND 2 V 37 (SUTURE) IMPLANT
SUT VIC AB 0 CT1 27 (SUTURE) ×4
SUT VIC AB 0 CT1 27XBRD ANBCTR (SUTURE) ×4 IMPLANT
SUT VIC AB 0 CTX 36 (SUTURE)
SUT VIC AB 0 CTX36XBRD ANTBCTR (SUTURE) IMPLANT
SUT VIC AB 1 CT1 27 (SUTURE) ×4
SUT VIC AB 1 CT1 27XBRD ANBCTR (SUTURE) ×4 IMPLANT
SUT VIC AB 1 CTX 36 (SUTURE) ×3
SUT VIC AB 1 CTX36XBRD ANBCTR (SUTURE) ×2 IMPLANT
SUT VIC AB 2-0 CT1 27 (SUTURE) ×2
SUT VIC AB 2-0 CT1 TAPERPNT 27 (SUTURE) ×2 IMPLANT
SUT VIC AB 3-0 CT1 27 (SUTURE) ×4
SUT VIC AB 3-0 CT1 TAPERPNT 27 (SUTURE) ×4 IMPLANT
SWAB COLLECTION DEVICE MRSA (MISCELLANEOUS) IMPLANT
SWAB CULTURE ESWAB REG 1ML (MISCELLANEOUS) IMPLANT
SYR 20ML ECCENTRIC (SYRINGE) IMPLANT
SYR 50ML LL SCALE MARK (SYRINGE) ×3 IMPLANT
SYR CONTROL 10ML LL (SYRINGE) ×6 IMPLANT
TOWEL OR 17X24 6PK STRL BLUE (TOWEL DISPOSABLE) ×3 IMPLANT
TOWEL OR 17X26 10 PK STRL BLUE (TOWEL DISPOSABLE) ×3 IMPLANT
TOWER CARTRIDGE SMART MIX (DISPOSABLE) IMPLANT
TRAY FOLEY CATH SILVER 14FR (SET/KITS/TRAYS/PACK) ×3 IMPLANT
WATER STERILE IRR 1000ML POUR (IV SOLUTION) IMPLANT

## 2016-11-11 NOTE — Discharge Instructions (Signed)

## 2016-11-11 NOTE — Anesthesia Postprocedure Evaluation (Signed)
Anesthesia Post Note  Patient: Dennis CampionJohn B Smith  Procedure(s) Performed: Procedure(s) (LRB): OPEN REDUCTION INTERNAL FIXATION (ORIF) LEFT FEMUR FRACTURE AND ARTHOTOMY (Left)     Patient location during evaluation: PACU Anesthesia Type: General Level of consciousness: awake and alert Pain management: pain level controlled Vital Signs Assessment: post-procedure vital signs reviewed and stable Respiratory status: spontaneous breathing, nonlabored ventilation and respiratory function stable Cardiovascular status: blood pressure returned to baseline and stable Postop Assessment: no signs of nausea or vomiting Anesthetic complications: no    Last Vitals:  Vitals:   11/11/16 1825 11/11/16 1843  BP: (!) 185/83 (!) 174/81  Pulse: 93 90  Resp: 10 15  Temp:      Last Pain:  Vitals:   11/11/16 1831  TempSrc:   PainSc: 6                  Breckin Savannah,W. EDMOND

## 2016-11-11 NOTE — Anesthesia Preprocedure Evaluation (Addendum)
Anesthesia Evaluation  Patient identified by MRN, date of birth, ID band Patient awake    Reviewed: Allergy & Precautions, NPO status , Patient's Chart, lab work & pertinent test results  Airway Mallampati: I  TM Distance: >3 FB Neck ROM: Full    Dental no notable dental hx.    Pulmonary neg pulmonary ROS, Current Smoker,    Pulmonary exam normal breath sounds clear to auscultation       Cardiovascular hypertension, negative cardio ROS Normal cardiovascular exam Rhythm:Regular Rate:Normal     Neuro/Psych negative neurological ROS  negative psych ROS   GI/Hepatic negative GI ROS, Neg liver ROS,   Endo/Other  negative endocrine ROS  Renal/GU negative Renal ROS  negative genitourinary   Musculoskeletal negative musculoskeletal ROS (+)   Abdominal   Peds negative pediatric ROS (+)  Hematology negative hematology ROS (+)   Anesthesia Other Findings EKG  Normal sinus rhythm Nonspecific T wave abnormality  Reproductive/Obstetrics negative OB ROS                                                             Anesthesia Evaluation  Patient identified by MRN, date of birth, ID band Patient awake    Reviewed: Allergy & Precautions, NPO status , Patient's Chart, lab work & pertinent test results  Airway Mallampati: II  TM Distance: >3 FB Neck ROM: Full    Dental no notable dental hx.    Pulmonary neg pulmonary ROS, Current Smoker,    Pulmonary exam normal breath sounds clear to auscultation       Cardiovascular hypertension, negative cardio ROS Normal cardiovascular exam Rhythm:Regular Rate:Normal     Neuro/Psych negative neurological ROS  negative psych ROS   GI/Hepatic negative GI ROS, Neg liver ROS,   Endo/Other  negative endocrine ROS  Renal/GU negative Renal ROS  negative genitourinary   Musculoskeletal negative musculoskeletal ROS (+)   Abdominal    Peds negative pediatric ROS (+)  Hematology negative hematology ROS (+)   Anesthesia Other Findings EKG  Normal sinus rhythm Nonspecific T wave abnormality  Reproductive/Obstetrics negative OB ROS                             Anesthesia Physical Anesthesia Plan  ASA: II  Anesthesia Plan: General   Post-op Pain Management:    Induction: Intravenous  PONV Risk Score and Plan: 2 and Ondansetron, Dexamethasone and Treatment may vary due to age or medical condition  Airway Management Planned: LMA  Additional Equipment:   Intra-op Plan:   Post-operative Plan:   Informed Consent:   Dental advisory given  Plan Discussed with: CRNA and Surgeon  Anesthesia Plan Comments: ( )        Anesthesia Quick Evaluation  Anesthesia Physical Anesthesia Plan  ASA: II  Anesthesia Plan: General   Post-op Pain Management:    Induction: Intravenous  PONV Risk Score and Plan: 2 and Ondansetron, Dexamethasone and Treatment may vary due to age or medical condition  Airway Management Planned: LMA  Additional Equipment:   Intra-op Plan:   Post-operative Plan:   Informed Consent:   Dental advisory given  Plan Discussed with: CRNA and Surgeon  Anesthesia Plan Comments: ( )        Anesthesia Quick Evaluation

## 2016-11-11 NOTE — Transfer of Care (Signed)
Immediate Anesthesia Transfer of Care Note  Patient: Dennis CampionJohn B Smith  Procedure(s) Performed: Procedure(s): OPEN REDUCTION INTERNAL FIXATION (ORIF) LEFT FEMUR FRACTURE AND ARTHOTOMY (Left)  Patient Location: PACU  Anesthesia Type:General  Level of Consciousness: awake, alert , oriented and patient cooperative  Airway & Oxygen Therapy: Patient Spontanous Breathing  Post-op Assessment: Report given to RN and Post -op Vital signs reviewed and stable  Post vital signs: Reviewed and stable  Last Vitals:  Vitals:   11/11/16 0432 11/11/16 1757  BP: 126/61   Pulse: 80   Resp: 18   Temp: 37 C (P) 36.5 C    Last Pain:  Vitals:   11/11/16 0539  TempSrc:   PainSc: Asleep      Patients Stated Pain Goal: 0 (11/11/16 0053)  Complications: No apparent anesthesia complications
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
## 2016-11-11 NOTE — Anesthesia Procedure Notes (Signed)
Procedure Name: Intubation Date/Time: 11/11/2016 2:43 PM Performed by: Melina Copa, Pranish Akhavan R Pre-anesthesia Checklist: Patient identified, Emergency Drugs available, Suction available and Patient being monitored Patient Re-evaluated:Patient Re-evaluated prior to induction Oxygen Delivery Method: Circle System Utilized Preoxygenation: Pre-oxygenation with 100% oxygen Induction Type: IV induction Ventilation: Mask ventilation without difficulty Laryngoscope Size: Mac and 4 Grade View: Grade I Tube type: Oral Tube size: 7.5 mm Number of attempts: 1 Airway Equipment and Method: Stylet Placement Confirmation: ETT inserted through vocal cords under direct vision,  positive ETCO2 and breath sounds checked- equal and bilateral Secured at: 21 cm Tube secured with: Tape Dental Injury: Teeth and Oropharynx as per pre-operative assessment

## 2016-11-12 ENCOUNTER — Encounter (HOSPITAL_COMMUNITY): Payer: Self-pay | Admitting: Orthopedic Surgery

## 2016-11-12 LAB — BASIC METABOLIC PANEL
ANION GAP: 6 (ref 5–15)
BUN: 21 mg/dL — AB (ref 6–20)
CHLORIDE: 96 mmol/L — AB (ref 101–111)
CO2: 28 mmol/L (ref 22–32)
Calcium: 8.6 mg/dL — ABNORMAL LOW (ref 8.9–10.3)
Creatinine, Ser: 0.96 mg/dL (ref 0.61–1.24)
GFR calc Af Amer: >60 mL/min (ref >60)
GLUCOSE: 124 mg/dL — AB (ref 65–99)
POTASSIUM: 4.1 mmol/L (ref 3.5–5.1)
Sodium: 130 mmol/L — ABNORMAL LOW (ref 135–145)

## 2016-11-12 LAB — CBC
HEMATOCRIT: 28.3 % — AB (ref 39.0–52.0)
Hemoglobin: 9.4 g/dL — ABNORMAL LOW (ref 13.0–17.0)
MCH: 31.5 pg (ref 26.0–34.0)
MCHC: 33.2 g/dL (ref 30.0–36.0)
MCV: 95 fL (ref 78.0–100.0)
Platelets: 212 10*3/uL (ref 150–400)
RBC: 2.98 MIL/uL — AB (ref 4.22–5.81)
RDW: 12.6 % (ref 11.5–15.5)
WBC: 12.6 10*3/uL — AB (ref 4.0–10.5)

## 2016-11-12 MED ORDER — GABAPENTIN 600 MG PO TABS
300.0000 mg | ORAL_TABLET | Freq: Three times a day (TID) | ORAL | Status: DC
  Administered 2016-11-12 – 2016-11-14 (×7): 300 mg via ORAL
  Filled 2016-11-12 (×7): qty 1

## 2016-11-12 MED ORDER — CELECOXIB 200 MG PO CAPS
200.0000 mg | ORAL_CAPSULE | Freq: Two times a day (BID) | ORAL | Status: DC
  Administered 2016-11-12 – 2016-11-14 (×5): 200 mg via ORAL
  Filled 2016-11-12 (×5): qty 1

## 2016-11-12 NOTE — Progress Notes (Signed)
PT Cancellation Note  Patient Details Name: Levander CampionJohn B Appleyard MRN: [id number] DOB: Dec 30, 1950   Cancelled Treatment:    Reason Eval/Treat Not Completed: Other (comment) (await removal of bedrest order for eval)   Latayvia Mandujano B Elmar Antigua 11/12/2016, 8:09 AM  Delaney MeigsMaija Tabor Cailie Bosshart, PT [phone]

## 2016-11-12 NOTE — Evaluation (Signed)
Physical Therapy Evaluation Patient Details Name: Dennis CampionJohn B Smith MRN: [id number] DOB: 1951/03/06 Today's Date: 11/12/2016   History of Present Illness  66 yo admitted after fall with left femur fx s/p ORIF. PMHx:Lt THA, back sx  Clinical Impression  Wife is present with pt in room and pt able to verbally state WB status. Pt is eager to ambulate and required VCs to wait for PT to help get up. Wife observed to have safety awareness and knowledge of pt's precautions and states she will be at home to assist pt upon d/c. Pt able to ambulate shorter distance, limited by pain and fatigue. Required VCs for sequencing of gait while maintaining proper WB status. Pt presents with deficits listed in PT problem list below and will benefit from acute therapy for mobilization, education on ambulation with LRAD, and strengthening to retun pt to PLOF and for safe d/c home.   Vitals:  Spo2 96% on RA pre ambulation; 99% on RA post ambulation     Follow Up Recommendations Home health PT    Equipment Recommendations  Rolling walker with 5" wheels    Recommendations for Other Services       Precautions / Restrictions Precautions Precautions: Fall Restrictions Weight Bearing Restrictions: Yes LLE Weight Bearing: Partial weight bearing LLE Partial Weight Bearing Percentage or Pounds: 50      Mobility  Bed Mobility Overal bed mobility: Needs Assistance Bed Mobility: Supine to Sit;Sit to Supine     Supine to sit: Min assist Sit to supine: Min assist   General bed mobility comments: Min A for movement of L LE off of and onto bed. VCs and Tcs for movement of Lt LE.   Transfers Overall transfer level: Needs assistance   Transfers: Sit to/from Stand Sit to Stand: Min guard         General transfer comment: Min guard for safety.   Ambulation/Gait Ambulation/Gait assistance: Min guard Ambulation Distance (Feet): 100 Feet Assistive device: Rolling walker (2 wheeled) Gait Pattern/deviations:  Step-to pattern;Decreased stride length Gait velocity: Slowed Gait velocity interpretation: Below normal speed for age/gender General Gait Details: Pt required initial education and VCs on PWB status and adhering to this precaution while ambulating with RW. VCs for sequencing. Min guard for safety. Distance limited d/t pain and fatigue.   Stairs Stairs: Yes Stairs assistance: Min guard Stair Management: No rails;With walker;Backwards;Forwards;Step to pattern Number of Stairs: 1 General stair comments: Pt showed knowledge of safe stair navigation with walker withoutn education and min VCs for sequencing. Used RW and navigated backwards up, forwards down.   Wheelchair Mobility    Modified Rankin (Stroke Patients Only)       Balance                                             Pertinent Vitals/Pain Pain Assessment: 0-10 Pain Score: 7  Pain Location: L LE Pain Descriptors / Indicators: Discomfort Pain Intervention(s): Limited activity within patient's tolerance;Monitored during session;Premedicated before session;Repositioned    Home Living Family/patient expects to be discharged to:: Private residence Living Arrangements: Spouse/significant other Available Help at Discharge: Family Type of Home: House Home Access: Stairs to enter Entrance Stairs-Rails: None Entrance Stairs-Number of Steps: 1 Home Layout: One level Home Equipment: Crutches;Bedside commode;Grab bars - tub/shower      Prior Function Level of Independence: Independent  Hand Dominance        Extremity/Trunk Assessment   Upper Extremity Assessment Upper Extremity Assessment: Overall WFL for tasks assessed    Lower Extremity Assessment Lower Extremity Assessment: LLE deficits/detail LLE Deficits / Details: Weakness grossly 2/5 and limited ROM s/p surgery     Cervical / Trunk Assessment Cervical / Trunk Assessment: Normal  Communication   Communication: No  difficulties  Cognition Arousal/Alertness: Awake/alert Behavior During Therapy: WFL for tasks assessed/performed Overall Cognitive Status: Within Functional Limits for tasks assessed                                        General Comments      Exercises General Exercises - Lower Extremity Long Arc QuadBarbaraann Smith;Left;10 reps;Seated Heel Slides: AAROM;Left;10 reps;Supine Hip Flexion/Marching: AAROM;Left;10 reps;Seated   Assessment/Plan    PT Assessment Patient needs continued PT services  PT Problem List Decreased strength;Decreased range of motion;Decreased activity tolerance;Decreased balance;Decreased mobility;Pain       PT Treatment Interventions DME instruction;Gait training;Stair training;Functional mobility training;Therapeutic activities;Therapeutic exercise;Balance training;Patient/family education    PT Goals (Current goals can be found in the Care Plan section)  Acute Rehab PT Goals Patient Stated Goal: Go home and back to work  PT Goal Formulation: With patient/family Time For Goal Achievement: 11/26/16 Potential to Achieve Goals: Good    Frequency Min 5X/week   Barriers to discharge        Co-evaluation               AM-PAC PT "6 Clicks" Daily Activity  Outcome Measure Difficulty turning over in bed (including adjusting bedclothes, sheets and blankets)?: A Little Difficulty moving from lying on back to sitting on the side of the bed? : Total Difficulty sitting down on and standing up from a chair with arms (e.g., wheelchair, bedside commode, etc,.)?: Total Help needed moving to and from a bed to chair (including a wheelchair)?: A Little Help needed walking in hospital room?: A Little Help needed climbing 3-5 steps with a railing? : A Little 6 Click Score: 14    End of Session Equipment Utilized During Treatment: Gait belt Activity Tolerance: Patient limited by fatigue;Patient limited by pain Patient left: in bed;with family/visitor  present;with call bell/phone within reach   PT Visit Diagnosis: Unsteadiness on feet (R26.81);Other abnormalities of gait and mobility (R26.89);Muscle weakness (generalized) (M62.81);Difficulty in walking, not elsewhere classified (R26.2);Pain Pain - Right/Left: Left Pain - part of body: Leg    Time: 0943-1010 PT Time Calculation (min) (ACUTE ONLY): 27 min   Charges:   PT Evaluation $PT Eval Low Complexity: 1 Procedure PT Treatments $Gait Training: 8-22 mins   PT G Codes:        Arneta Cliche, SPT Acute Rehab [phone]   Arneta Cliche 11/12/2016, 10:50 AM

## 2016-11-12 NOTE — Progress Notes (Signed)
Patient ID: Dennis CampionJohn B Smith, male   DOB: [birth year], 66 y.o.   MRN: [id number] PATIENT ID: Dennis CampionJohn B Smith  MRN: [id number]  DOB/AGE:  [birth year] / 66 y.o.  1 Day Post-Op Procedure(s) (LRB): OPEN REDUCTION INTERNAL FIXATION (ORIF) LEFT FEMUR FRACTURE AND ARTHOTOMY (Left)    PROGRESS NOTE Subjective: Patient is alert, oriented, no Nausea, no Vomiting, yes passing gas, . Taking PO well. Denies SOB, Chest or Calf Pain. Using Incentive Spirometer, PAS in place. Ambulate 50% WB Patient reports pain as  5/10  .    Objective: Vital signs in last 24 hours: Vitals:   11/11/16 1945 11/11/16 2005 11/12/16 0059 11/12/16 0440  BP:  (!) 155/74 136/61 (!) 164/86  Pulse:  75 89 88  Resp:  18 18 18   Temp: 97.8 F (36.6 C) 98.9 F (37.2 C) 99.2 F (37.3 C) 98.6 F (37 C)  TempSrc:  Oral Oral Oral  SpO2:  97% 95% 95%  Weight:      Height:          Intake/Output from previous day: I/O last 3 completed shifts: In: 2050 [P.O.:150; I.V.:1900] Out: 3101 [Urine:2800; Stool:1; Blood:300]   Intake/Output this shift: No intake/output data recorded.   LABORATORY DATA: No results for input(s): WBC, HGB, HCT, PLT, NA, K, CL, CO2, BUN, CREATININE, GLUCOSE, GLUCAP, INR, CALCIUM in the last 72 hours.  Invalid input(s): PT, 2  Examination: Neurologically intact ABD soft Neurovascular intact Sensation intact distally Intact pulses distally Dorsiflexion/Plantar flexion intact Incision: no drainage No cellulitis present Compartment soft} XR AP&Lat of hip shows well placed\fixed THA, NCB Plate  Assessment:   1 Day Post-Op Procedure(s) (LRB): OPEN REDUCTION INTERNAL FIXATION (ORIF) LEFT FEMUR FRACTURE AND ARTHOTOMY (Left) ADDITIONAL DIAGNOSIS:  Expected Acute Blood Loss Anemia, Hypertension  Plan: PT/OT WBAT, THA  DVT Prophylaxis: SCDx72 hrs, ASA 325 mg BID x 2 weeks  DISCHARGE PLAN: Home, prob tomorrow  DISCHARGE NEEDS: HHPT, Walker and 3-in-1 comode seat, may use crutches

## 2016-11-12 NOTE — Progress Notes (Signed)
OT Cancellation Note  Patient Details Name: Dennis CampionJohn B Smith MRN: [id number] DOB: Jun 21, 1950   Cancelled Treatment:    Reason Eval/Treat Not Completed: Other (comment). Pt on bedrest. Please update activity orders to initiate OT. Thanks  [facility] LPWARD,HILLARY  Zigmond Trela, OT/L  [phone] 11/12/2016 11/12/2016, 8:18 AM

## 2016-11-12 NOTE — Plan of Care (Signed)
Problem: Safety: Goal: Ability to remain free from injury will improve Outcome: Progressing Patient remained in bed this shift; educated of falls precaution. Patient compliant.  Problem: Pain Managment: Goal: General experience of comfort will improve Outcome: Progressing Patient pain managed with PRNs oxycodone, robaxin, and  tylenol. Patient has decreased physiological signs of  Pain.  Will continue with cares.

## 2016-11-12 NOTE — Progress Notes (Addendum)
Patient returned to 5N24 from PACU. Patient alert and oriented x 4, denies pain the left hip incision, there is aquacel to left hip incision; it is clean dry and intact. Patient oriented to room and call light; placed on falls precaution. Will continue with care.

## 2016-11-12 NOTE — Progress Notes (Signed)
Patient foley removed this morning. Patient dtv after noon

## 2016-11-13 LAB — TYPE AND SCREEN
ABO/RH(D): B POS
ANTIBODY SCREEN: NEGATIVE
Unit division: 0
Unit division: 0

## 2016-11-13 LAB — CBC
HEMATOCRIT: 26.4 % — AB (ref 39.0–52.0)
HEMOGLOBIN: 8.9 g/dL — AB (ref 13.0–17.0)
MCH: 31.6 pg (ref 26.0–34.0)
MCHC: 33.7 g/dL (ref 30.0–36.0)
MCV: 93.6 fL (ref 78.0–100.0)
Platelets: 211 10*3/uL (ref 150–400)
RBC: 2.82 MIL/uL — ABNORMAL LOW (ref 4.22–5.81)
RDW: 12.4 % (ref 11.5–15.5)
WBC: 11 10*3/uL — ABNORMAL HIGH (ref 4.0–10.5)

## 2016-11-13 LAB — BPAM RBC
Blood Product Expiration Date: 201808012359
Blood Product Expiration Date: 201808012359
UNIT TYPE AND RH: 7300
UNIT TYPE AND RH: 7300

## 2016-11-13 LAB — BASIC METABOLIC PANEL
ANION GAP: 7 (ref 5–15)
BUN: 20 mg/dL (ref 6–20)
CHLORIDE: 98 mmol/L — AB (ref 101–111)
CO2: 28 mmol/L (ref 22–32)
CREATININE: 1.03 mg/dL (ref 0.61–1.24)
Calcium: 8.2 mg/dL — ABNORMAL LOW (ref 8.9–10.3)
GFR calc Af Amer: >60 mL/min (ref >60)
GFR calc non Af Amer: >60 mL/min (ref >60)
GLUCOSE: 113 mg/dL — AB (ref 65–99)
Potassium: 4 mmol/L (ref 3.5–5.1)
Sodium: 133 mmol/L — ABNORMAL LOW (ref 135–145)

## 2016-11-13 MED ORDER — SODIUM CHLORIDE 0.9 % IV BOLUS (SEPSIS)
500.0000 mL | Freq: Once | INTRAVENOUS | Status: AC
  Administered 2016-11-13: 500 mL via INTRAVENOUS

## 2016-11-13 NOTE — Plan of Care (Signed)
Problem: Safety: Goal: Ability to remain free from injury will improve Outcome: Progressing Safety precautions maintained  Problem: Pain Managment: Goal: General experience of comfort will improve Outcome: Progressing Medicated twice for pain with moderate relief  Problem: Skin Integrity: Goal: Risk for impaired skin integrity will decrease Outcome: Progressing No skin issues noted  Problem: Tissue Perfusion: Goal: Risk factors for ineffective tissue perfusion will decrease Outcome: Progressing Denies signs of DVT SCDs on  Problem: Activity: Goal: Risk for activity intolerance will decrease Outcome: Progressing Ambulated to BR with assistance and tolerated well  Problem: Bowel/Gastric: Goal: Will not experience complications related to bowel motility Outcome: Progressing No gastric or bowel issues reported

## 2016-11-13 NOTE — Discharge Summary (Addendum)
Patient ID: Dennis Smith MRN: [id number] DOB/AGE: [birth year] 66 y.o.  Admit date: 11/08/2016 Discharge date:11/14/2016 Admission Diagnoses:  Active Problems:   Periprosthetic fracture of shaft of femur   Discharge Diagnoses:  Same  Past Medical History:  Diagnosis Date  . Hypertension     Surgeries: Procedure(s): OPEN REDUCTION INTERNAL FIXATION (ORIF) LEFT FEMUR FRACTURE AND ARTHOTOMY on 11/08/2016 - 11/11/2016   Consultants: Treatment Team:  Teryl Lucy, MD Gean Birchwood, MD  Discharged Condition: Improved  Hospital Course: Dennis Smith is an 66 y.o. male who was admitted 11/08/2016 for operative treatment of<principal problem not specified>. Patient has severe unremitting pain that affects sleep, daily activities, and work/hobbies. After pre-op clearance the patient was taken to the operating room on 11/08/2016 - 11/11/2016 and underwent  Procedure(s): OPEN REDUCTION INTERNAL FIXATION (ORIF) LEFT FEMUR FRACTURE AND ARTHOTOMY.    Patient was given perioperative antibiotics: Anti-infectives    Start     Dose/Rate Route Frequency Ordered Stop   11/11/16 1330  ceFAZolin (ANCEF) IVPB 2g/100 mL premix  Status:  Discontinued     2 g 200 mL/hr over 30 Minutes Intravenous To [facility]Stay [facility] 11/10/16 0919 11/11/16 2002   11/11/16 1230  ceFAZolin (ANCEF) IVPB 2g/100 mL premix     2 g 200 mL/hr over 30 Minutes Intravenous To ShortStay Surgical 11/10/16 1606 11/11/16 1443       Patient was given sequential compression devices, early ambulation, and chemoprophylaxis to prevent DVT.  Patient benefited maximally from hospital stay and there were no complications.    Recent vital signs: Patient Vitals for the past 24 hrs:  BP Temp Temp src Pulse Resp SpO2  11/13/16 0538 (!) 106/53 98.7 F (37.1 C) Tympanic 74 19 96 %  11/12/16 2001 (!) 132/59 (!) 101.1 F (38.4 C) Oral 88 18 97 %  11/12/16 1500 (!) 118/50 98.6 F (37 C) Oral 85 18 96 %  11/12/16 1022 - - - - - 96 %  11/12/16  0900 (!) 160/66 98.8 F (37.1 C) - 95 18 96 %     Recent laboratory studies:  Recent Labs  11/12/16 0620 11/13/16 0246  WBC 12.6* 11.0*  HGB 9.4* 8.9*  HCT 28.3* 26.4*  PLT 212 211  NA 130* 133*  K 4.1 4.0  CL 96* 98*  CO2 28 28  BUN 21* 20  CREATININE 0.96 1.03  GLUCOSE 124* 113*  CALCIUM 8.6* 8.2*     Discharge Medications:   Allergies as of 11/13/2016      Reactions   No Known Allergies       Medication List    STOP taking these medications   ibuprofen 200 MG tablet Commonly known as:  ADVIL,MOTRIN     TAKE these medications   amLODipine 10 MG tablet Commonly known as:  NORVASC Take 10 mg by mouth at bedtime.   aspirin EC 325 MG tablet Take 1 tablet (325 mg total) by mouth 2 (two) times daily. What changed:  medication strength  how much to take  when to take this   cetirizine 10 MG tablet Commonly known as:  ZYRTEC Take 10 mg by mouth daily.   CINNAMON PO Take 1 capsule by mouth daily.   FISH OIL PO Take 1 capsule by mouth daily with supper.   hydrochlorothiazide 25 MG tablet Commonly known as:  HYDRODIURIL Take 25 mg by mouth every morning.   lisinopril 20 MG tablet Commonly known as:  PRINIVIL,ZESTRIL Take 20 mg by mouth every morning.  methocarbamol 500 MG tablet Commonly known as:  ROBAXIN Take 1 tablet (500 mg total) by mouth 2 (two) times daily with a meal.   omeprazole 20 MG capsule Commonly known as:  PRILOSEC Take 20 mg by mouth daily before breakfast.   oxyCODONE-acetaminophen 5-325 MG tablet Commonly known as:  ROXICET Take 1 tablet by mouth every 4 (four) hours as needed.   TURMERIC PO Take 1 capsule by mouth daily.   VITAMIN B-12 PO Take 1 tablet by mouth daily.   VITAMIN C PO Take 1 tablet by mouth every other day.       Diagnostic Studies: Dg Pelvis 1-2 Views  Result Date: 11/08/2016 CLINICAL DATA:  Fall from roof EXAM: PELVIS - 1-2 VIEW COMPARISON:  None. FINDINGS: The SI joints are symmetric. The pubic  symphysis is intact. 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 There is ache distal periprosthetic fracture of the proximal shaft of the femur IMPRESSION: 1. 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 Periprosthetic fracture involving the proximal shaft of the femur Electronically Signed   By: Jasmine Pang M.D.   On: 11/08/2016 22:09   Ct Hip Left Wo Contrast  Result Date: 11/09/2016 CLINICAL DATA:  Evaluate left hip fracture. EXAM: CT OF THE LEFT HIP WITHOUT CONTRAST TECHNIQUE: Multidetector CT imaging of the left hip was performed according to the standard protocol. Multiplanar CT image reconstructions were also generated. COMPARISON:  Radiographs 11/08/2016 FINDINGS: Bones/Joint/Cartilage As demonstrated on the radiographs there is a periprosthetic fracture involving the left femur. Proximally there are small nondisplaced fractures involving the greater and lesser trochanters. In the subtrochanteric region there is a nondisplaced fracture involving the posterior cortex. More inferiorly there also anterior and anterolateral cortical fractures connecting with the posterior cortex fracture forming slightly displaced butterfly type fragment. Maximum displacement is 5 mm. No fracture at the distal tip of the prosthesis. The last component is intact. No acetabular fracture. The visualized left hemipelvis is intact. The pubic symphysis and left SI joint are intact. Ligaments Suboptimally assessed by CT. Muscles and Tendons Grossly normal. Mild fatty change involving the gluteal muscles and proximal left thigh muscles. Soft tissues No obvious hematoma.  Hemarthrosis suspected. No significant intrapelvic abnormalities are demonstrated. Sigmoid diverticulosis is noted along with aortic and iliac artery atherosclerotic calcifications. 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 IMPRESSION: 1. Trochanteric and subtrochanteric  periprosthetic femur fractures as discussed above. 2. No acetabular or left hemipelvic fractures. Electronically Signed   By: Rudie Meyer M.D.   On: 11/09/2016 10:26   Dg C-arm 61-120 Min  Result Date: 11/11/2016 CLINICAL DATA:  Left hip revision. EXAM: DG C-ARM 61-120 MIN; LEFT FEMUR 2 VIEWS COMPARISON:  CT of the left hip 11/09/2016 FINDINGS: Intraoperative fluoroscopic images from left femoral fracture fixation demonstrate cerclage wire and sideplate fixation of comminuted proximal left femoral fracture. The alignment is near anatomic. No new fractures are seen. IMPRESSION: Intraoperative fluoroscopic images from left femoral fracture fixation without immediate complications. Electronically Signed   By: Ted Mcalpine M.D.   On: 11/11/2016 17:44   Dg Femur Min 2 Views Left  Result Date: 11/11/2016 CLINICAL DATA:  Left hip revision. EXAM: DG C-ARM 61-120 MIN; LEFT FEMUR 2 VIEWS COMPARISON:  CT of the left hip 11/09/2016 FINDINGS: Intraoperative fluoroscopic images from left femoral fracture fixation demonstrate cerclage wire and sideplate fixation of comminuted proximal left femoral fracture. The alignment is near anatomic. No new fractures are seen. IMPRESSION: Intraoperative  fluoroscopic images from left femoral fracture fixation without immediate complications. Electronically Signed   By: Ted Mcalpineobrinka  Dimitrova M.D.   On: 11/11/2016 17:44   Dg Femur Min 2 Views Left  Result Date: 11/08/2016 CLINICAL DATA:  Fall from roof EXAM: LEFT FEMUR 2 VIEWS COMPARISON:  None. FINDINGS: Patient is status post left hip replacement. No dislocation. There is an acute fracture involving the proximal shaft of the femur around the distal portion of the femoral prosthesis. About 4 mm of separation of the lateral fracture fragments. No significant angulation. IMPRESSION: Acute, minimally separated fracture involving the proximal shaft of the femur around the distal portion of the femoral prosthesis. Electronically  Signed   By: Jasmine PangKim  Fujinaga M.D.   On: 11/08/2016 22:11    Disposition:   Discharge Instructions    Call MD / Call 911    Complete by:  As directed    If you experience chest pain or shortness of breath, CALL 911 and be transported to the hospital emergency room.  If you develope a fever above 101 F, pus (white drainage) or increased drainage or redness at the wound, or calf pain, call your surgeon's office.   Constipation Prevention    Complete by:  As directed    Drink plenty of fluids.  Prune juice may be helpful.  You may use a stool softener, such as Colace (over the counter) 100 mg twice a day.  Use MiraLax (over the counter) for constipation as needed.   Diet - low sodium heart healthy    Complete by:  As directed    Do not sit on low chairs, stoools or toilet seats, as it may be difficult to get up from low surfaces    Complete by:  As directed    Driving restrictions    Complete by:  As directed    No driving for 2 weeks   Follow the hip precautions as taught in Physical Therapy    Complete by:  As directed    Increase activity slowly as tolerated    Complete by:  As directed    Partial weight bearing    Complete by:  As directed    % Body Weight:  50   Laterality:  left   Extremity:  Lower   Patient may shower    Complete by:  As directed    You may shower without a dressing once there is no drainage.  Do not wash over the wound.  If drainage remains, cover wound with plastic wrap and then shower.      Follow-up Information    Gean Birchwoodowan, Frank, MD Follow up in 10 day(s).   Specialty:  Orthopedic Surgery Contact information: [address] [place] [address] [phone]            Signed: Henry RusselHILLIPS, ERIC R 11/13/2016, 7:37 AM

## 2016-11-13 NOTE — Progress Notes (Signed)
Pt stating he  has had 5 bowel movements all diarrhea,  feels like he is about to faint. Spouse states this has happen to him before with a previous surgery. BP 79/40, pulse 88, 90% O2 sat on 2LNC. Called MD [place] office, secretary text paged MD, waiting on response.

## 2016-11-13 NOTE — Progress Notes (Signed)
Attempted to contact Dannielle BurnEric Phillips, PA x2 waiting on response. Will continue to monitor

## 2016-11-13 NOTE — Care Management Note (Addendum)
Case Management Note  Patient Details  Name: Dennis CampionJohn B Smith MRN: [id number] Date of Birth: 1951-04-22  Subjective/Objective:    66 yr old male s/p ORIF of the left femur.                Action/Plan: Case manager spoke with patient and his wife concerning discharge plan. Patient has RW and 3in1. He has [employer] insurance, CM will [facility] Reference # is [id number]. CM faxed orders , op note and demographics to [facility]. CM will follow.   Expected Discharge Date:  11/13/16               Expected Discharge Plan:  Home w Home Health Services  In-House Referral:     Discharge planning Services  CM Consult  Post Acute Care Choice:  Home Health Choice offered to:  Patient, Spouse  DME Arranged:  N/A (Has DME) DME Agency:  NA  HH Arranged:  PT HH Agency:   ([facility])  Status of Service:  In process, will continue to follow  If discussed at Long Length of Stay Meetings, dates discussed:    Additional Comments:  Durenda GuthrieBrady, Carrie Usery Naomi, RN 11/13/2016, 2:38 PM

## 2016-11-13 NOTE — Progress Notes (Signed)
PATIENT ID: Dennis CampionJohn B Smith  MRN: [id number]  DOB/AGE:  Apr 12, 1951 / 66 y.o.  2 Days Post-Op Procedure(s) (LRB): OPEN REDUCTION INTERNAL FIXATION (ORIF) LEFT FEMUR FRACTURE AND ARTHOTOMY (Left)    PROGRESS NOTE Subjective: Patient is alert, oriented, no Nausea, no Vomiting, yes passing gas, . Taking PO well. Denies SOB, Chest or Calf Pain. Using Incentive Spirometer, PAS in place. Ambulate 50% weight bearing on left leg Patient reports pain as  mild  .    Objective: Vital signs in last 24 hours: Vitals:   11/12/16 1022 11/12/16 1500 11/12/16 2001 11/13/16 0538  BP:  (!) 118/50 (!) 132/59 (!) 106/53  Pulse:  85 88 74  Resp:  18 18 19   Temp:  98.6 F (37 C) (!) 101.1 F (38.4 C) 98.7 F (37.1 C)  TempSrc:  Oral Oral Tympanic  SpO2: 96% 96% 97% 96%  Weight:      Height:          Intake/Output from previous day: I/O last 3 completed shifts: In: 1070 [P.O.:870; I.V.:200] Out: 2855 [Urine:2855]   Intake/Output this shift: No intake/output data recorded.   LABORATORY DATA:  Recent Labs  11/12/16 0620 11/13/16 0246  WBC 12.6* 11.0*  HGB 9.4* 8.9*  HCT 28.3* 26.4*  PLT 212 211  NA 130* 133*  K 4.1 4.0  CL 96* 98*  CO2 28 28  BUN 21* 20  CREATININE 0.96 1.03  GLUCOSE 124* 113*  CALCIUM 8.6* 8.2*    Examination: Neurologically intact Neurovascular intact Sensation intact distally Intact pulses distally Dorsiflexion/Plantar flexion intact Incision: dressing C/D/I No cellulitis present Compartment soft} XR AP&Lat of hip shows well placed\fixed THA  Assessment:   2 Days Post-Op Procedure(s) (LRB): OPEN REDUCTION INTERNAL FIXATION (ORIF) LEFT FEMUR FRACTURE AND ARTHOTOMY (Left) ADDITIONAL DIAGNOSIS:  Expected Acute Blood Loss Anemia, Hypertension  Plan: PT/OT WBAT, THA  DVT Prophylaxis: SCDx72 hrs, ASA 325 mg BID x 2 weeks  DISCHARGE PLAN: Home  DISCHARGE NEEDS: HHPT, Walker and 3-in-1 comode seat, pt may use crutches

## 2016-11-13 NOTE — Progress Notes (Signed)
Physical Therapy Treatment Patient Details Name: NILS THOR MRN: [id number] DOB: 1951/04/08 Today's Date: 11/13/2016    History of Present Illness 66 yo admitted after fall with left femur fx s/p ORIF. PMHx:Lt THA, back sx    PT Comments    Pt agreeable to ambulate with PT and able to ambulate further with RW and increased Lt LE ROM today. Pt with some labored breathing post ambulation, but VSS (see below). Pt tolerated standing exercises well and will continue to follow for mobilization, increased activity tolerance, and strengthening to return to PLOF.  Vitals: Post ambulation: SpO2 on RA 93% and HR 86    Follow Up Recommendations  Home health PT     Equipment Recommendations       Recommendations for Other Services       Precautions / Restrictions Precautions Precautions: Fall Restrictions Weight Bearing Restrictions: Yes LLE Weight Bearing: Partial weight bearing LLE Partial Weight Bearing Percentage or Pounds: 50    Mobility  Bed Mobility Overal bed mobility: Needs Assistance Bed Mobility: Supine to Sit     Supine to sit: Min guard     General bed mobility comments: Min guard for safety   Transfers Overall transfer level: Needs assistance   Transfers: Sit to/from Stand Sit to Stand: Min guard         General transfer comment: Min guard for safety.   Ambulation/Gait Ambulation/Gait assistance: Min guard Ambulation Distance (Feet): 200 Feet Assistive device: Rolling walker (2 wheeled) Gait Pattern/deviations: Step-to pattern;Decreased stride length Gait velocity: Slowed Gait velocity interpretation: Below normal speed for age/gender General Gait Details: Min guard for safety and VCs for form using RW, including not stepping past RW    Stairs            Wheelchair Mobility    Modified Rankin (Stroke Patients Only)       Balance Overall balance assessment: Needs assistance Sitting-balance support: No upper extremity  supported;Feet unsupported Sitting balance-Leahy Scale: Good     Standing balance support: Bilateral upper extremity supported;During functional activity Standing balance-Leahy Scale: Poor Standing balance comment: requires rw for ue support to balance in standing                             Cognition Arousal/Alertness: Awake/alert Behavior During Therapy: WFL for tasks assessed/performed Overall Cognitive Status: Within Functional Limits for tasks assessed                                        Exercises Total Joint Exercises Hip ABduction/ADduction: AROM;Left;Standing;10 reps Knee Flexion: AROM;Left;Standing;10 reps Marching in Standing: AROM;Left;Standing;10 reps Standing Hip Extension: AROM;Left;10 reps;Standing    General Comments        Pertinent Vitals/Pain Pain Assessment: 0-10 Pain Score: 6  Pain Location: L LE Pain Descriptors / Indicators: Discomfort Pain Intervention(s): Limited activity within patient's tolerance;Monitored during session;Repositioned    Home Living                      Prior Function            PT Goals (current goals can now be found in the care plan section) Progress towards PT goals: Progressing toward goals    Frequency    Min 5X/week      PT Plan Current plan remains appropriate    Co-evaluation  AM-PAC PT "6 Clicks" Daily Activity  Outcome Measure  Difficulty turning over in bed (including adjusting bedclothes, sheets and blankets)?: None Difficulty moving from lying on back to sitting on the side of the bed? : A Little Difficulty sitting down on and standing up from a chair with arms (e.g., wheelchair, bedside commode, etc,.)?: A Little Help needed moving to and from a bed to chair (including a wheelchair)?: A Little Help needed walking in hospital room?: None Help needed climbing 3-5 steps with a railing? : A Little 6 Click Score: 20    End of Session  Equipment Utilized During Treatment: Gait belt Activity Tolerance: Patient tolerated treatment well Patient left: in chair;with call bell/phone within reach Nurse Communication: Mobility status PT Visit Diagnosis: Unsteadiness on feet (R26.81);Other abnormalities of gait and mobility (R26.89);Muscle weakness (generalized) (M62.81);Difficulty in walking, not elsewhere classified (R26.2);Pain Pain - Right/Left: Left Pain - part of body: Leg     Time: [id number] PT Time Calculation (min) (ACUTE ONLY): 19 min  Charges:  $Gait Training: 8-22 mins                    G Codes:       Arneta ClicheVictoria Tapanga [place], SPT Acute Rehab [phone]    Arneta ClicheVictoria Kida Digiulio 11/13/2016, 10:17 AM

## 2016-11-13 NOTE — Evaluation (Signed)
Occupational Therapy Evaluation Patient Details Name: Dennis CampionJohn B Linderman MRN: [id number] DOB: April 26, 1951 Today's Date: 11/13/2016    History of Present Illness 66 yo admitted after fall with left femur fx s/p ORIF. PMHx:Lt THA, back sx   Clinical Impression   Patient evaluated by Occupational Therapy with no further acute OT needs identified. All education has been completed and the patient has no further questions. See below for any follow-up Occupational Therapy or equipment needs. OT to sign off. Thank you for referral.      Follow Up Recommendations  No OT follow up    Equipment Recommendations  None recommended by OT    Recommendations for Other Services       Precautions / Restrictions Precautions Precautions: Fall Restrictions Weight Bearing Restrictions: Yes LLE Weight Bearing: Partial weight bearing LLE Partial Weight Bearing Percentage or Pounds: 50      Mobility Bed Mobility Overal bed mobility: Independent                Transfers Overall transfer level: Modified independent                    Balance                                           ADL either performed or assessed with clinical judgement   ADL Overall ADL's : Needs assistance/impaired Eating/Feeding: Independent   Grooming: Wash/dry hands   Upper Body Bathing: Independent   Lower Body Bathing: Moderate assistance Lower Body Bathing Details (indicate cue type and reason): able to cross and touch R LE. pt unable to reach L LE but has reacher and education on use         Toilet Transfer: Supervision/safety       Tub/ Engineer, structuralhower Transfer: Independent   Functional mobility during ADLs: Supervision/safety    Pt educated on bathing and avoid washing directly on incision. Pt educated to use new wash cloth and towel each day. Pt educated to allow water to run across dressing and not to soak in a tub at this time. Pt advised RN will instruct on any bandages  required otherwise is open to air.      Vision Baseline Vision/History: No visual deficits       Perception     Praxis      Pertinent Vitals/Pain Pain Assessment: Faces Faces Pain Scale: Hurts little more Pain Location: L LE Pain Descriptors / Indicators: Discomfort Pain Intervention(s): Repositioned;Premedicated before session;Monitored during session     Hand Dominance Right   Extremity/Trunk Assessment Upper Extremity Assessment Upper Extremity Assessment: Overall WFL for tasks assessed   Lower Extremity Assessment Lower Extremity Assessment: Defer to PT evaluation   Cervical / Trunk Assessment Cervical / Trunk Assessment: Normal   Communication Communication Communication: No difficulties   Cognition Arousal/Alertness: Awake/alert Behavior During Therapy: WFL for tasks assessed/performed Overall Cognitive Status: Within Functional Limits for tasks assessed                                     General Comments       Exercises     Shoulder Instructions      Home Living Family/patient expects to be discharged to:: Private residence Living Arrangements: Spouse/significant other Available Help at Discharge: Family Type of Home: House  Home Access: Stairs to enter Entrance Stairs-Number of Steps: 1 Entrance Stairs-Rails: None Home Layout: One level     Bathroom Shower/Tub: Chief Strategy Officer: Handicapped height Bathroom Accessibility: Yes   Home Equipment: Crutches;Bedside commode;Grab bars - tub/shower;Adaptive equipment Adaptive Equipment: Reacher Additional Comments: wife will be present for all adls if needed      Prior Functioning/Environment Level of Independence: Independent        Comments: previous experience with bil Le surgeries with wife (A)        OT Problem List:        OT Treatment/Interventions:      OT Goals(Current goals can be found in the care plan section) Acute Rehab OT Goals Patient  Stated Goal: Go home and back to work   OT Frequency:     Barriers to D/C:            Co-evaluation              AM-PAC PT "6 Clicks" Daily Activity     Outcome Measure Help from another person eating meals?: None Help from another person taking care of personal grooming?: None Help from another person toileting, which includes using toliet, bedpan, or urinal?: None Help from another person bathing (including washing, rinsing, drying)?: None Help from another person to put on and taking off regular upper body clothing?: None Help from another person to put on and taking off regular lower body clothing?: A Little 6 Click Score: 23   End of Session Equipment Utilized During Treatment: Gait belt;Rolling walker Nurse Communication: Mobility status;Precautions  Activity Tolerance: Patient tolerated treatment well Patient left: in bed;with call bell/phone within reach  OT Visit Diagnosis: Unsteadiness on feet (R26.81)                Time: 1610-9604 OT Time Calculation (min): 13 min Charges:  OT General Charges $OT Visit: 1 Procedure OT Evaluation $OT Eval Low Complexity: 1 Procedure G-Codes:      Mateo Flow   OTR/L Pager: [phone] Office: [phone] .   Boone Master B 11/13/2016, 3:16 PM

## 2016-11-14 LAB — CBC
HCT: 25.8 % — ABNORMAL LOW (ref 39.0–52.0)
Hemoglobin: 8.6 g/dL — ABNORMAL LOW (ref 13.0–17.0)
MCH: 31.4 pg (ref 26.0–34.0)
MCHC: 33.3 g/dL (ref 30.0–36.0)
MCV: 94.2 fL (ref 78.0–100.0)
PLATELETS: 264 10*3/uL (ref 150–400)
RBC: 2.74 MIL/uL — AB (ref 4.22–5.81)
RDW: 12.4 % (ref 11.5–15.5)
WBC: 12.4 10*3/uL — ABNORMAL HIGH (ref 4.0–10.5)

## 2016-11-14 NOTE — Progress Notes (Signed)
Physical Therapy Treatment Patient Details Name: Dennis Smith MRN: [id number] DOB: 06/24/1950 Today's Date: 11/14/2016    History of Present Illness 66 yo admitted after fall with left femur fx s/p ORIF. PMHx:Lt THA, back sx    PT Comments    Pt demonstrates good tolerance for gait this session. Minimal increase in pain with increased distance noted this session, but over good adherence to [facility]. Pt continues to benefit from HHPT to maximize his outcomes at discharge.     Follow Up Recommendations  Home health PT     Equipment Recommendations  Rolling walker with 5" wheels    Recommendations for Other Services       Precautions / Restrictions Precautions Precautions: Fall Restrictions Weight Bearing Restrictions: Yes LLE Weight Bearing: Partial weight bearing LLE Partial Weight Bearing Percentage or Pounds: 50    Mobility  Bed Mobility               General bed mobility comments: sitting up in recliner when PT arrives  Transfers Overall transfer level: Modified independent Equipment used: Rolling walker (2 wheeled) Transfers: Sit to/from Stand Sit to Stand: Modified independent (Device/Increase time)            Ambulation/Gait Ambulation/Gait assistance: Supervision Ambulation Distance (Feet): 250 Feet Assistive device: Rolling walker (2 wheeled) Gait Pattern/deviations: Step-to pattern;Decreased stride length Gait velocity: Slowed Gait velocity interpretation: Below normal speed for age/gender General Gait Details: Min guard for safety and VCs for form using RW, including not stepping past RW    Careers information officer    Modified Rankin (Stroke Patients Only)       Balance Overall balance assessment: Needs assistance Sitting-balance support: No upper extremity supported;Feet unsupported Sitting balance-Leahy Scale: Good     Standing balance support: Bilateral upper extremity supported;During functional  activity Standing balance-Leahy Scale: Poor Standing balance comment: reliant on UE for support in standing                            Cognition Arousal/Alertness: Awake/alert Behavior During Therapy: WFL for tasks assessed/performed Overall Cognitive Status: Within Functional Limits for tasks assessed                                        Exercises      General Comments General comments (skin integrity, edema, etc.): Verbally reviewed HEP, All questions were answered.       Pertinent Vitals/Pain Pain Assessment: 0-10 Pain Score: 3  Pain Location: L LE Pain Descriptors / Indicators: Discomfort Pain Intervention(s): Monitored during session;Premedicated before session;Repositioned    Home Living                      Prior Function            PT Goals (current goals can now be found in the care plan section) Acute Rehab PT Goals Patient Stated Goal: Go home and back to work  Progress towards PT goals: Progressing toward goals    Frequency    Min 5X/week      PT Plan Current plan remains appropriate    Co-evaluation              AM-PAC PT "6 Clicks" Daily Activity  Outcome Measure  Difficulty turning over in bed (including adjusting  bedclothes, sheets and blankets)?: None Difficulty moving from lying on back to sitting on the side of the bed? : None Difficulty sitting down on and standing up from a chair with arms (e.g., wheelchair, bedside commode, etc,.)?: None Help needed moving to and from a bed to chair (including a wheelchair)?: None Help needed walking in hospital room?: None Help needed climbing 3-5 steps with a railing? : A Little 6 Click Score: 23    End of Session Equipment Utilized During Treatment: Gait belt Activity Tolerance: Patient tolerated treatment well Patient left: in chair;with call bell/phone within reach Nurse Communication: Mobility status PT Visit Diagnosis: Unsteadiness on feet  (R26.81);Other abnormalities of gait and mobility (R26.89);Muscle weakness (generalized) (M62.81);Difficulty in walking, not elsewhere classified (R26.2);Pain Pain - Right/Left: Left Pain - part of body: Leg     Time: 7829-56210901-0913 PT Time Calculation (min) (ACUTE ONLY): 12 min  Charges:  $Gait Training: 8-22 mins                    G Codes:       Colin BroachSabra M. Cydney Alvarenga PT, DPT  [phone]    Ruel FavorsSabra Aletha HalimMarie Sheika Coutts 11/14/2016, 1:49 PM

## 2016-11-14 NOTE — Progress Notes (Signed)
Confirmed with Maralyn SagoSarah, Case Manager; patient set up for home health via [facility].  Patient has RW and 3in1, no additional equipment needs.

## 2016-11-14 NOTE — Progress Notes (Signed)
Subjective: 3 Days Post-Op Procedure(s) (LRB): OPEN REDUCTION INTERNAL FIXATION (ORIF) LEFT FEMUR FRACTURE AND ARTHOTOMY (Left)  Activity level:  wbat Diet tolerance:  ok Voiding:  ok Patient reports pain as mild.    Objective: Vital signs in last 24 hours: Temp:  [98.6 F (37 C)-99.3 F (37.4 C)] 98.8 F (37.1 C) (07/14 0425) Pulse Rate:  [77-93] 77 (07/14 0425) Resp:  [14-16] 16 (07/14 0425) BP: (79-129)/(40-64) 106/55 (07/14 0425) SpO2:  [90 %-96 %] 94 % (07/14 0425)  Labs:  Recent Labs  11/12/16 0620 11/13/16 0246 11/14/16 0458  HGB 9.4* 8.9* 8.6*    Recent Labs  11/13/16 0246 11/14/16 0458  WBC 11.0* 12.4*  RBC 2.82* 2.74*  HCT 26.4* 25.8*  PLT 211 264    Recent Labs  11/12/16 0620 11/13/16 0246  NA 130* 133*  K 4.1 4.0  CL 96* 98*  CO2 28 28  BUN 21* 20  CREATININE 0.96 1.03  GLUCOSE 124* 113*  CALCIUM 8.6* 8.2*   No results for input(s): LABPT, INR in the last 72 hours.  Physical Exam:  Neurologically intact ABD soft Neurovascular intact Sensation intact distally Intact pulses distally Dorsiflexion/Plantar flexion intact Incision: dressing C/D/I and no drainage No cellulitis present Compartment soft  Assessment/Plan:  3 Days Post-Op Procedure(s) (LRB): OPEN REDUCTION INTERNAL FIXATION (ORIF) LEFT FEMUR FRACTURE AND ARTHOTOMY (Left) Advance diet Up with therapy Discharge home with home health today if he is doing well when sitting up in chair and walking in room. Continue on ASA 325mg  BID for DVT prevention. Follow up with Dr. Turner Danielsowan in office 2 weeks.  Balinda Heacock, Ginger OrganNDREW PAUL 11/14/2016, 7:19 AM

## 2016-11-14 NOTE — Progress Notes (Signed)
Pt has tolerated being up in a chair, ambulating to the bathroom and walking in halls with therapy today.  BP 97/44 this am, am BP meds--lisinopril and HCTZ not given this am due to low BP and episode of low BP yesterday afternoon (pt also declined to take meds).   Pt able to check his BP at home, instructed him and his wife to recheck his BP later today and tomorrow, if BP back to his normal take his meds as before, if low call his prescribing MD of the BP and discuss if changes need to be made.

## 2016-11-14 NOTE — Progress Notes (Signed)
Discharge instructions reviewed with pt and wife by RN, Ladean Rayaonstance. Copy of instructions and scripts given to pt.  Pt d/c'd via wheelchair with belongings, with wife.           Escorted by unit NT.

## 2017-06-21 ENCOUNTER — Other Ambulatory Visit (HOSPITAL_BASED_OUTPATIENT_CLINIC_OR_DEPARTMENT_OTHER): Payer: Managed Care, Other (non HMO)

## 2017-06-21 ENCOUNTER — Other Ambulatory Visit: Payer: Self-pay | Admitting: Physician Assistant

## 2017-06-21 ENCOUNTER — Ambulatory Visit
Admission: RE | Admit: 2017-06-21 | Discharge: 2017-06-21 | Disposition: A | Discharge status: Home / Self Care | Payer: Managed Care, Other (non HMO) | Source: Ambulatory Visit | Attending: Physician Assistant | Admitting: Physician Assistant

## 2017-06-21 DIAGNOSIS — R05 Cough: Secondary | ICD-10-CM

## 2017-06-21 DIAGNOSIS — R059 Cough, unspecified: Secondary | ICD-10-CM

## 2017-06-21 DIAGNOSIS — R0989 Other specified symptoms and signs involving the circulatory and respiratory systems: Secondary | ICD-10-CM

## 2018-04-06 DIAGNOSIS — M25552 Pain in left hip: Secondary | ICD-10-CM | POA: Diagnosis not present

## 2018-04-18 DIAGNOSIS — H43813 Vitreous degeneration, bilateral: Secondary | ICD-10-CM | POA: Diagnosis not present

## 2018-04-18 DIAGNOSIS — H43392 Other vitreous opacities, left eye: Secondary | ICD-10-CM | POA: Diagnosis not present

## 2018-04-18 DIAGNOSIS — H2181 Floppy iris syndrome: Secondary | ICD-10-CM | POA: Diagnosis not present

## 2018-04-18 DIAGNOSIS — H35372 Puckering of macula, left eye: Secondary | ICD-10-CM | POA: Diagnosis not present

## 2018-04-18 DIAGNOSIS — H2511 Age-related nuclear cataract, right eye: Secondary | ICD-10-CM | POA: Diagnosis not present

## 2018-04-18 DIAGNOSIS — H3562 Retinal hemorrhage, left eye: Secondary | ICD-10-CM | POA: Diagnosis not present

## 2018-07-01 DIAGNOSIS — Z125 Encounter for screening for malignant neoplasm of prostate: Secondary | ICD-10-CM | POA: Diagnosis not present

## 2018-07-01 DIAGNOSIS — E782 Mixed hyperlipidemia: Secondary | ICD-10-CM | POA: Diagnosis not present

## 2018-07-01 DIAGNOSIS — R7303 Prediabetes: Secondary | ICD-10-CM | POA: Diagnosis not present

## 2018-07-01 DIAGNOSIS — I10 Essential (primary) hypertension: Secondary | ICD-10-CM | POA: Diagnosis not present

## 2018-07-04 DIAGNOSIS — E559 Vitamin D deficiency, unspecified: Secondary | ICD-10-CM | POA: Diagnosis not present

## 2018-07-04 DIAGNOSIS — E782 Mixed hyperlipidemia: Secondary | ICD-10-CM | POA: Diagnosis not present

## 2018-07-04 DIAGNOSIS — I10 Essential (primary) hypertension: Secondary | ICD-10-CM | POA: Diagnosis not present

## 2018-07-04 DIAGNOSIS — F17200 Nicotine dependence, unspecified, uncomplicated: Secondary | ICD-10-CM | POA: Diagnosis not present

## 2018-07-04 DIAGNOSIS — R7301 Impaired fasting glucose: Secondary | ICD-10-CM | POA: Diagnosis not present

## 2019-01-11 DIAGNOSIS — I10 Essential (primary) hypertension: Secondary | ICD-10-CM | POA: Diagnosis not present

## 2019-01-11 DIAGNOSIS — D539 Nutritional anemia, unspecified: Secondary | ICD-10-CM | POA: Diagnosis not present

## 2019-01-11 DIAGNOSIS — E559 Vitamin D deficiency, unspecified: Secondary | ICD-10-CM | POA: Diagnosis not present

## 2019-01-11 DIAGNOSIS — R7301 Impaired fasting glucose: Secondary | ICD-10-CM | POA: Diagnosis not present

## 2019-01-11 DIAGNOSIS — E782 Mixed hyperlipidemia: Secondary | ICD-10-CM | POA: Diagnosis not present

## 2019-01-16 DIAGNOSIS — E782 Mixed hyperlipidemia: Secondary | ICD-10-CM | POA: Diagnosis not present

## 2019-01-16 DIAGNOSIS — Z8639 Personal history of other endocrine, nutritional and metabolic disease: Secondary | ICD-10-CM | POA: Diagnosis not present

## 2019-01-16 DIAGNOSIS — Z0000 Encounter for general adult medical examination without abnormal findings: Secondary | ICD-10-CM | POA: Diagnosis not present

## 2019-01-16 DIAGNOSIS — I10 Essential (primary) hypertension: Secondary | ICD-10-CM | POA: Diagnosis not present

## 2019-01-16 DIAGNOSIS — E875 Hyperkalemia: Secondary | ICD-10-CM | POA: Diagnosis not present

## 2019-01-16 DIAGNOSIS — F17200 Nicotine dependence, unspecified, uncomplicated: Secondary | ICD-10-CM | POA: Diagnosis not present

## 2019-01-16 DIAGNOSIS — R7303 Prediabetes: Secondary | ICD-10-CM | POA: Diagnosis not present

## 2019-04-17 DIAGNOSIS — E875 Hyperkalemia: Secondary | ICD-10-CM | POA: Diagnosis not present

## 2019-04-19 DIAGNOSIS — Z961 Presence of intraocular lens: Secondary | ICD-10-CM | POA: Diagnosis not present

## 2019-04-19 DIAGNOSIS — H33032 Retinal detachment with giant retinal tear, left eye: Secondary | ICD-10-CM | POA: Diagnosis not present

## 2019-04-19 DIAGNOSIS — H43813 Vitreous degeneration, bilateral: Secondary | ICD-10-CM | POA: Diagnosis not present

## 2019-04-19 DIAGNOSIS — H35372 Puckering of macula, left eye: Secondary | ICD-10-CM | POA: Diagnosis not present

## 2019-06-12 ENCOUNTER — Ambulatory Visit: Payer: PPO | Attending: Internal Medicine

## 2019-06-12 DIAGNOSIS — Z23 Encounter for immunization: Secondary | ICD-10-CM | POA: Insufficient documentation

## 2019-06-12 NOTE — Progress Notes (Signed)
   Covid-19 Vaccination Clinic  Name:  RUSTIN ERHART    MRN: [id number] DOB: Sep 25, 1950  06/12/2019  Mr. Colson was observed post Covid-19 immunization for 15 minutes without incidence. He was provided with Vaccine Information Sheet and instruction to access the V-Safe system.   Mr. Deyoung was instructed to call 911 with any severe reactions post vaccine: Marland Kitchen Difficulty breathing  . Swelling of your face and throat  . A fast heartbeat  . A bad rash all over your body  . Dizziness and weakness    Immunizations Administered    Name Date Dose VIS Date Route   Pfizer COVID-19 Vaccine 06/12/2019  3:04 PM 0.3 mL 04/14/2019 Intramuscular   Manufacturer: [employer], [employer]   Lot: [id number]   NDC: [address]-6

## 2019-07-07 ENCOUNTER — Ambulatory Visit: Payer: PPO | Attending: Internal Medicine

## 2019-07-07 DIAGNOSIS — Z23 Encounter for immunization: Secondary | ICD-10-CM | POA: Insufficient documentation

## 2019-07-07 NOTE — Progress Notes (Signed)
   Covid-19 Vaccination Clinic  Name:  Dennis Smith    MRN: [id number] DOB: 1950/07/05  07/07/2019  Mr. Dennis Smith was observed post Covid-19 immunization for 15 minutes without incident. He was provided with Vaccine Information Sheet and instruction to access the V-Safe system.   Mr. Dennis Smith was instructed to call 911 with any severe reactions post vaccine: Marland Kitchen Difficulty breathing  . Swelling of face and throat  . A fast heartbeat  . A bad rash all over body  . Dizziness and weakness   Immunizations Administered    Name Date Dose VIS Date Route   Pfizer COVID-19 Vaccine 07/07/2019 11:21 AM 0.3 mL 04/14/2019 Intramuscular   Manufacturer: [employer], [employer]   Lot: [id number]   NDC: [address]-1

## 2019-07-17 DIAGNOSIS — R7303 Prediabetes: Secondary | ICD-10-CM | POA: Diagnosis not present

## 2019-07-17 DIAGNOSIS — F17200 Nicotine dependence, unspecified, uncomplicated: Secondary | ICD-10-CM | POA: Diagnosis not present

## 2019-07-17 DIAGNOSIS — E559 Vitamin D deficiency, unspecified: Secondary | ICD-10-CM | POA: Diagnosis not present

## 2019-07-17 DIAGNOSIS — I10 Essential (primary) hypertension: Secondary | ICD-10-CM | POA: Diagnosis not present

## 2019-07-17 DIAGNOSIS — E782 Mixed hyperlipidemia: Secondary | ICD-10-CM | POA: Diagnosis not present

## 2019-07-18 DIAGNOSIS — I10 Essential (primary) hypertension: Secondary | ICD-10-CM | POA: Diagnosis not present

## 2019-07-18 DIAGNOSIS — F17200 Nicotine dependence, unspecified, uncomplicated: Secondary | ICD-10-CM | POA: Diagnosis not present

## 2019-07-18 DIAGNOSIS — R7303 Prediabetes: Secondary | ICD-10-CM | POA: Diagnosis not present

## 2019-07-18 DIAGNOSIS — E782 Mixed hyperlipidemia: Secondary | ICD-10-CM | POA: Diagnosis not present

## 2019-07-18 DIAGNOSIS — E559 Vitamin D deficiency, unspecified: Secondary | ICD-10-CM | POA: Diagnosis not present

## 2019-08-14 ENCOUNTER — Ambulatory Visit: Payer: PPO | Attending: Internal Medicine

## 2019-08-14 ENCOUNTER — Other Ambulatory Visit: Payer: Self-pay

## 2019-08-14 DIAGNOSIS — Z20822 Contact with and (suspected) exposure to covid-19: Secondary | ICD-10-CM | POA: Diagnosis not present

## 2019-08-15 LAB — SARS-COV-2, NAA 2 DAY TAT

## 2019-08-15 LAB — NOVEL CORONAVIRUS, NAA: SARS-CoV-2, NAA: NOT DETECTED

## 2020-02-07 DIAGNOSIS — R202 Paresthesia of skin: Secondary | ICD-10-CM | POA: Diagnosis not present

## 2020-02-07 DIAGNOSIS — R7303 Prediabetes: Secondary | ICD-10-CM | POA: Diagnosis not present

## 2020-02-07 DIAGNOSIS — L989 Disorder of the skin and subcutaneous tissue, unspecified: Secondary | ICD-10-CM | POA: Diagnosis not present

## 2020-02-07 DIAGNOSIS — F17200 Nicotine dependence, unspecified, uncomplicated: Secondary | ICD-10-CM | POA: Diagnosis not present

## 2020-02-07 DIAGNOSIS — G4739 Other sleep apnea: Secondary | ICD-10-CM | POA: Diagnosis not present

## 2020-02-07 DIAGNOSIS — E559 Vitamin D deficiency, unspecified: Secondary | ICD-10-CM | POA: Diagnosis not present

## 2020-02-07 DIAGNOSIS — M79641 Pain in right hand: Secondary | ICD-10-CM | POA: Diagnosis not present

## 2020-02-07 DIAGNOSIS — R200 Anesthesia of skin: Secondary | ICD-10-CM | POA: Diagnosis not present

## 2020-02-07 DIAGNOSIS — E782 Mixed hyperlipidemia: Secondary | ICD-10-CM | POA: Diagnosis not present

## 2020-02-07 DIAGNOSIS — Z125 Encounter for screening for malignant neoplasm of prostate: Secondary | ICD-10-CM | POA: Diagnosis not present

## 2020-02-07 DIAGNOSIS — Z0000 Encounter for general adult medical examination without abnormal findings: Secondary | ICD-10-CM | POA: Diagnosis not present

## 2020-02-07 DIAGNOSIS — J302 Other seasonal allergic rhinitis: Secondary | ICD-10-CM | POA: Diagnosis not present

## 2020-02-07 DIAGNOSIS — I10 Essential (primary) hypertension: Secondary | ICD-10-CM | POA: Diagnosis not present

## 2020-02-07 DIAGNOSIS — M79642 Pain in left hand: Secondary | ICD-10-CM | POA: Diagnosis not present

## 2020-02-21 DIAGNOSIS — F17200 Nicotine dependence, unspecified, uncomplicated: Secondary | ICD-10-CM | POA: Diagnosis not present

## 2020-02-21 DIAGNOSIS — Z122 Encounter for screening for malignant neoplasm of respiratory organs: Secondary | ICD-10-CM | POA: Diagnosis not present

## 2020-04-01 ENCOUNTER — Encounter: Payer: Self-pay | Admitting: Pulmonary Disease

## 2020-04-01 ENCOUNTER — Ambulatory Visit (INDEPENDENT_AMBULATORY_CARE_PROVIDER_SITE_OTHER): Payer: PPO | Admitting: Pulmonary Disease

## 2020-04-01 ENCOUNTER — Other Ambulatory Visit: Payer: Self-pay

## 2020-04-01 VITALS — BP 140/68 | HR 85

## 2020-04-01 DIAGNOSIS — G4733 Obstructive sleep apnea (adult) (pediatric): Secondary | ICD-10-CM | POA: Diagnosis not present

## 2020-04-01 NOTE — Progress Notes (Signed)
Dennis Smith    [id number]    12/01/1950  Primary Care Physician:Williams, Karis Juba, PA-C  Referring Physician: Roger Kill, PA-C [id number],  [place] [address]  Chief complaint:   Patient being seen for snoring, witnessed apneas, nonrestorative sleep, frequent awakenings  HPI:  Spouse has told him about snoring, witnessed apneas Spouse has obstructive sleep apnea  Usually goes to bed about 10 PM Falls asleep soon after Wakes up several times during the night Final wake up time between 6 and 6:30 AM Weight has been relatively stable  No family history of obstructive sleep apnea Denies significant dryness, occasionally does have dryness No headaches in the morning Occasional night sweats  Has only ever had gasping respirations months Memory is good Obstructive sleep apnea Family  History of high blood pressure, history of asthma  Active smoker, half pack a day  Outpatient Encounter Medications as of 04/01/2020  Medication Sig  . amLODipine (NORVASC) 10 MG tablet Take 10 mg by mouth at bedtime.  . Ascorbic Acid (VITAMIN C PO) Take 1 tablet by mouth every other day.  Marland Kitchen aspirin EC 325 MG tablet Take 1 tablet (325 mg total) by mouth 2 (two) times daily.  . cetirizine (ZYRTEC) 10 MG tablet Take 10 mg by mouth daily.  Marland Kitchen CINNAMON PO Take 1 capsule by mouth daily.  . Cyanocobalamin (VITAMIN B-12 PO) Take 1 tablet by mouth daily.  . hydrochlorothiazide (HYDRODIURIL) 25 MG tablet Take 25 mg by mouth every morning.   Marland Kitchen lisinopril (PRINIVIL,ZESTRIL) 20 MG tablet Take 20 mg by mouth every morning.   . methocarbamol (ROBAXIN) 500 MG tablet Take 1 tablet (500 mg total) by mouth 2 (two) times daily with a meal.  . Omega-3 Fatty Acids (FISH OIL PO) Take 1 capsule by mouth daily with supper.  Marland Kitchen omeprazole (PRILOSEC) 20 MG capsule Take 20 mg by mouth daily before breakfast.  . TURMERIC PO Take 1 capsule by mouth daily.  Marland Kitchen  oxyCODONE-acetaminophen (ROXICET) 5-325 MG tablet Take 1 tablet by mouth every 4 (four) hours as needed. (Patient not taking: Reported on 04/01/2020)   No facility-administered encounter medications on file as of 04/01/2020.    Allergies as of 04/01/2020 - Review Complete 04/01/2020  Allergen Reaction Noted  . No known allergies  11/10/2016    Past Medical History:  Diagnosis Date  . Hypertension     Past Surgical History:  Procedure Laterality Date  . BACK SURGERY    . JOINT REPLACEMENT    . ORIF FEMUR FRACTURE Left 11/11/2016   Procedure: OPEN REDUCTION INTERNAL FIXATION (ORIF) LEFT FEMUR FRACTURE AND ARTHOTOMY;  Surgeon: Gean Birchwood, MD;  Location: [facility] OR;  Service: Orthopedics;  Laterality: Left;    No family history on file.  Social History   Socioeconomic History  . Marital status: Married    Spouse name: Not on file  . Number of children: Not on file  . Years of education: Not on file  . Highest education level: Not on file  Occupational History  . Not on file  Tobacco Use  . Smoking status: Current Every Day Smoker    Packs/day: 1.00    Years: 40.00    Pack years: 40.00    Types: Cigarettes  . Smokeless tobacco: Never Used  Substance and Sexual Activity  . Alcohol use: Yes    Alcohol/week: 8.0 standard drinks    Types: 8 Glasses of wine per week  .  Drug use: No  . Sexual activity: Yes  Other Topics Concern  . Not on file  Social History Narrative  . Not on file   Social Determinants of Health   Financial Resource Strain:   . Difficulty of Paying Living Expenses: Not on file  Food Insecurity:   . Worried About Programme researcher, broadcasting/film/video in the Last Year: Not on file  . Ran Out of Food in the Last Year: Not on file  Transportation Needs:   . Lack of Transportation (Medical): Not on file  . Lack of Transportation (Non-Medical): Not on file  Physical Activity:   . Days of Exercise per Week: Not on file  . Minutes of Exercise per Session: Not on file    Stress:   . Feeling of Stress : Not on file  Social Connections:   . Frequency of Communication with Friends and Family: Not on file  . Frequency of Social Gatherings with Friends and Family: Not on file  . Attends Religious Services: Not on file  . Active Member of Clubs or Organizations: Not on file  . Attends Banker Meetings: Not on file  . Marital Status: Not on file  Intimate Partner Violence:   . Fear of Current or Ex-Partner: Not on file  . Emotionally Abused: Not on file  . Physically Abused: Not on file  . Sexually Abused: Not on file    Review of Systems  Respiratory: Positive for apnea.   Psychiatric/Behavioral: Positive for sleep disturbance.    Vitals:   04/01/20 0945  BP: 140/68  Pulse: 85  SpO2: 98%     Physical Exam Constitutional:      Appearance: Normal appearance.  HENT:     Mouth/Throat:     Mouth: Mucous membranes are moist.     Comments: Mallampati two Eyes:     General:        Right eye: No discharge.        Left eye: No discharge.     Pupils: Pupils are equal, round, and reactive to light.  Cardiovascular:     Rate and Rhythm: Normal rate and regular rhythm.     Heart sounds: No murmur heard.  No friction rub.  Pulmonary:     Effort: No respiratory distress.     Breath sounds: No stridor. No wheezing.  Chest:     Chest wall: No tenderness.  Musculoskeletal:     Cervical back: No rigidity or tenderness.  Neurological:     Mental Status: He is alert.  Psychiatric:        Mood and Affect: Mood normal.    Results of the Epworth flowsheet 04/01/2020  Sitting and reading 0  Watching TV 2  Sitting, inactive in a public place (e.g. a theatre or a meeting) 0  As a passenger in a car for an hour without a break 0  Lying down to rest in the afternoon when circumstances permit 2  Sitting and talking to someone 0  Sitting quietly after a lunch without alcohol 0  In a car, while stopped for a few minutes in traffic 0  Total  score 4    Data Reviewed: Referral records reviewed  Assessment: Moderate probability of significant obstructive sleep apnea  Nonrestorative sleep  Nicotine dependence  Pathophysiology of sleep disordered breathing discussed with the patient Treatment options for sleep disordered breathing discussed with the patient  Plan/Recommendations: Schedule patient for home sleep study  Risks of not treating sleep disordered breathing  discussed with the patient  Smoking cessation counseling  Tentative follow-up in 3 months   Virl Diamond MD [facility] 04/01/2020, 10:05 AM  CC: Roger Kill, *

## 2020-04-01 NOTE — Patient Instructions (Signed)
Moderate probability of significant obstructive sleep apnea  We will schedule you for home sleep study We will update your results as soon as reviewed  Regular exercises will help sleep quality Continue working on smoking cessation  Tentative follow-up in about 3 months Sleep Apnea Sleep apnea affects breathing during sleep. It causes breathing to stop for a short time or to become shallow. It can also increase the risk of:  Heart attack.  Stroke.  Being very overweight (obese).  Diabetes.  Heart failure.  Irregular heartbeat. The goal of treatment is to help you breathe normally again. What are the causes? There are three kinds of sleep apnea:  Obstructive sleep apnea. This is caused by a blocked or collapsed airway.  Central sleep apnea. This happens when the brain does not send the right signals to the muscles that control breathing.  Mixed sleep apnea. This is a combination of obstructive and central sleep apnea. The most common cause of this condition is a collapsed or blocked airway. This can happen if:  Your throat muscles are too relaxed.  Your tongue and tonsils are too large.  You are overweight.  Your airway is too small. What increases the risk?  Being overweight.  Smoking.  Having a small airway.  Being older.  Being male.  Drinking alcohol.  Taking medicines to calm yourself (sedatives or tranquilizers).  Having family members with the condition. What are the signs or symptoms?  Trouble staying asleep.  Being sleepy or tired during the day.  Getting angry a lot.  Loud snoring.  Headaches in the morning.  Not being able to focus your mind (concentrate).  Forgetting things.  Less interest in sex.  Mood swings.  Personality changes.  Feelings of sadness (depression).  Waking up a lot during the night to pee (urinate).  Dry mouth.  Sore throat. How is this diagnosed?  Your medical history.  A physical exam.  A test  that is done when you are sleeping (sleep study). The test is most often done in a sleep lab but may also be done at home. How is this treated?   Sleeping on your side.  Using a medicine to get rid of mucus in your nose (decongestant).  Avoiding the use of alcohol, medicines to help you relax, or certain pain medicines (narcotics).  Losing weight, if needed.  Changing your diet.  Not smoking.  Using a machine to open your airway while you sleep, such as: ? An oral appliance. This is a mouthpiece that shifts your lower jaw forward. ? A CPAP device. This device blows air through a mask when you breathe out (exhale). ? An EPAP device. This has valves that you put in each nostril. ? A BPAP device. This device blows air through a mask when you breathe in (inhale) and breathe out.  Having surgery if other treatments do not work. It is important to get treatment for sleep apnea. Without treatment, it can lead to:  High blood pressure.  Coronary artery disease.  In men, not being able to have an erection (impotence).  Reduced thinking ability. Follow these instructions at home: Lifestyle  Make changes that your doctor recommends.  Eat a healthy diet.  Lose weight if needed.  Avoid alcohol, medicines to help you relax, and some pain medicines.  Do not use any products that contain nicotine or tobacco, such as cigarettes, e-cigarettes, and chewing tobacco. If you need help quitting, ask your doctor. General instructions  Take over-the-counter and prescription  medicines only as told by your doctor.  If you were given a machine to use while you sleep, use it only as told by your doctor.  If you are having surgery, make sure to tell your doctor you have sleep apnea. You may need to bring your device with you.  Keep all follow-up visits as told by your doctor. This is important. Contact a doctor if:  The machine that you were given to use during sleep bothers you or does not  seem to be working.  You do not get better.  You get worse. Get help right away if:  Your chest hurts.  You have trouble breathing in enough air.  You have an uncomfortable feeling in your back, arms, or stomach.  You have trouble talking.  One side of your body feels weak.  A part of your face is hanging down. These symptoms may be an emergency. Do not wait to see if the symptoms will go away. Get medical help right away. Call your local emergency services (911 in the [place] Do not drive yourself to the hospital. Summary  This condition affects breathing during sleep.  The most common cause is a collapsed or blocked airway.  The goal of treatment is to help you breathe normally while you sleep. This information is not intended to replace advice given to you by your health care provider. Make sure you discuss any questions you have with your health care provider. Document Revised: 02/04/2018 Document Reviewed: 12/14/2017 Elsevier Patient Education  2020 [employer].

## 2020-04-16 ENCOUNTER — Other Ambulatory Visit: Payer: Self-pay

## 2020-04-16 ENCOUNTER — Encounter: Payer: Self-pay | Admitting: Emergency Medicine

## 2020-04-16 ENCOUNTER — Emergency Department
Admission: EM | Admit: 2020-04-16 | Discharge: 2020-04-16 | Disposition: A | Discharge status: Home / Self Care | Payer: PPO | Source: Home / Self Care | Attending: Internal Medicine | Admitting: Internal Medicine

## 2020-04-16 DIAGNOSIS — T783XXA Angioneurotic edema, initial encounter: Secondary | ICD-10-CM | POA: Diagnosis not present

## 2020-04-16 HISTORY — DX: Hyperlipidemia, unspecified: E78.5

## 2020-04-16 MED ORDER — AMLODIPINE BESYLATE 5 MG PO TABS: 5.0000 mg | ORAL_TABLET | Freq: Every day | ORAL | 1 refills | Status: DC

## 2020-04-16 MED ORDER — PREDNISONE 20 MG PO TABS: 40.0000 mg | ORAL_TABLET | Freq: Every day | ORAL | 0 refills | Status: AC

## 2020-04-16 MED ORDER — DIPHENHYDRAMINE HCL 25 MG PO TABS: 25.0000 mg | ORAL_TABLET | Freq: Four times a day (QID) | ORAL | 0 refills | Status: DC | PRN

## 2020-04-16 NOTE — Discharge Instructions (Signed)
Please stop taking lisinopril Take other medications as prescribed I will prescribed amlodipine in place of lisinopril. Return precautions given.

## 2020-04-16 NOTE — ED Provider Notes (Signed)
[facility]    CSN: [id number] Arrival date & time: 04/16/20  1056      History   Chief Complaint Chief Complaint  Patient presents with  . Sore Throat    HPI Dennis Smith is a 69 y.o. male comes to the urgent care with complaints of sudden onset of swelling of his throat and tongue last night. Onset was sudden. It is associated with some scratchy and dry throat. He is also experiencing some hoarseness of voice. No unusual dietary changes. No wheezing or stridor. Patient has been on lisinopril for blood pressure control for several years. No rash noted. No fever or chills. No sick contacts. Tongue swelling has improved. He continues to have some discomfort in his throat but denies any shortness of breath.Marland Kitchen   HPI  Past Medical History:  Diagnosis Date  . Hyperlipidemia   . Hypertension     Patient Active Problem List   Diagnosis Date Noted  . Periprosthetic fracture of shaft of femur 11/08/2016    Past Surgical History:  Procedure Laterality Date  . BACK SURGERY    . JOINT REPLACEMENT    . ORIF FEMUR FRACTURE Left 11/11/2016   Procedure: OPEN REDUCTION INTERNAL FIXATION (ORIF) LEFT FEMUR FRACTURE AND ARTHOTOMY;  Surgeon: Gean Birchwood, MD;  Location: [facility] OR;  Service: Orthopedics;  Laterality: Left;       Home Medications    Prior to Admission medications   Medication Sig Start Date End Date Taking? Authorizing Provider  amLODipine (NORVASC) 5 MG tablet Take 1 tablet (5 mg total) by mouth daily. 04/16/20   LampteyBritta Mccreedy, MD  Ascorbic Acid (VITAMIN C PO) Take 1 tablet by mouth every other day.    [provider]  aspirin EC 325 MG tablet Take 1 tablet (325 mg total) by mouth 2 (two) times daily. 11/11/16   Allena Katz, PA-C  atorvastatin (LIPITOR) 10 MG tablet Take 10 mg by mouth daily.    [provider]  calcium-vitamin D (OSCAL WITH D) 500-200 MG-UNIT tablet Take 1 tablet by mouth.    [provider]  cetirizine  (ZYRTEC) 10 MG tablet Take 10 mg by mouth daily.    [provider]  CINNAMON PO Take 1 capsule by mouth daily.    [provider]  Cyanocobalamin (VITAMIN B-12 PO) Take 1 tablet by mouth daily.    [provider]  diphenhydrAMINE (BENADRYL) 25 MG tablet Take 1 tablet (25 mg total) by mouth every 6 (six) hours as needed for up to 3 days. 04/16/20 04/19/20  Merrilee Jansky, MD  hydrochlorothiazide (HYDRODIURIL) 25 MG tablet Take 25 mg by mouth every morning.  09/15/16   [provider]  methocarbamol (ROBAXIN) 500 MG tablet Take 1 tablet (500 mg total) by mouth 2 (two) times daily with a meal. 11/11/16   Allena Katz, PA-C  Omega-3 Fatty Acids (FISH OIL PO) Take 1 capsule by mouth daily with supper.    [provider]  omeprazole (PRILOSEC) 20 MG capsule Take 20 mg by mouth daily before breakfast. 10/17/15   [provider]  predniSONE (DELTASONE) 20 MG tablet Take 2 tablets (40 mg total) by mouth daily for 3 days. 04/16/20 04/19/20  Merrilee Jansky, MD  TURMERIC PO Take 1 capsule by mouth daily.    [provider]  lisinopril (PRINIVIL,ZESTRIL) 20 MG tablet Take 20 mg by mouth every morning.  09/15/16 04/16/20  [provider]    Family History Family  History  Problem Relation Age of Onset  . Heart attack Father     Social History Social History   Tobacco Use  . Smoking status: Current Every Day Smoker    Packs/day: 1.00    Years: 40.00    Pack years: 40.00    Types: Cigarettes  . Smokeless tobacco: Never Used  Substance Use Topics  . Alcohol use: Yes    Alcohol/week: 8.0 standard drinks    Types: 8 Glasses of wine per week  . Drug use: No     Allergies   No known allergies   Review of Systems Review of Systems  Constitutional: Negative.   HENT: Negative for postnasal drip and sore throat.   Respiratory: Negative.   Cardiovascular: Negative.   Gastrointestinal: Negative.      Physical  Exam Triage Vital Signs ED Triage Vitals  Enc Vitals Group     BP 04/16/20 1118 (!) 162/86     Pulse Rate 04/16/20 1118 80     Resp 04/16/20 1118 16     Temp 04/16/20 1118 98.3 F (36.8 C)     Temp Source 04/16/20 1118 Oral     SpO2 04/16/20 1118 98 %     Weight 04/16/20 1119 170 lb (77.1 kg)     Height 04/16/20 1119 5\' 10"  (1.778 m)     Head Circumference --      Peak Flow --      Pain Score 04/16/20 1119 0     Pain Loc --      Pain Edu? --      Excl. in GC? --    No data found.  Updated Vital Signs BP (!) 162/86 (BP Location: Right Arm)   Pulse 80   Temp 98.3 F (36.8 C) (Oral)   Resp 16   Ht 5\' 10"  (1.778 m)   Wt 77.1 kg   SpO2 98%   BMI 24.39 kg/m   Visual Acuity Right Eye Distance:   Left Eye Distance:   Bilateral Distance:    Right Eye Near:   Left Eye Near:    Bilateral Near:     Physical Exam Vitals and nursing note reviewed.  Constitutional:      General: He is not in acute distress.    Appearance: He is not ill-appearing.  HENT:     Mouth/Throat:     Comments: Mild uvula swelling with erythema. No tongue swelling. Mallampati class II . Posterior pharyngeal wall is erythematous. Neurological:     Mental Status: He is alert.      UC Treatments / Results  Labs (all labs ordered are listed, but only abnormal results are displayed) Labs Reviewed - No data to display  EKG   Radiology No results found.  Procedures Procedures (including critical care time)  Medications Ordered in UC Medications - No data to display  Initial Impression / Assessment and Plan / UC Course  I have reviewed the triage vital signs and the nursing notes.  Pertinent labs & imaging results that were available during my care of the patient were reviewed by me and considered in my medical decision making (see chart for details).     1. Angioedema: Stop lisinopril Start amlodipine Prednisone to 40 mg orally daily for 3 days Benadryl 25 mg to be taken at  bedtime Monitor blood pressure If you notice worsening neck tightness, noisy breathing or difficulty swallowing please go to the emergency room to be evaluated. Final Clinical Impressions(s) / UC Diagnoses  Final diagnoses:  Angioedema, initial encounter     Discharge Instructions     Please stop taking lisinopril Take other medications as prescribed I will prescribed amlodipine in place of lisinopril. Return precautions given.   ED Prescriptions    Medication Sig Dispense Auth. Provider   predniSONE (DELTASONE) 20 MG tablet Take 2 tablets (40 mg total) by mouth daily for 3 days. 6 tablet Frantz Quattrone, Britta Mccreedy, MD   diphenhydrAMINE (BENADRYL) 25 MG tablet Take 1 tablet (25 mg total) by mouth every 6 (six) hours as needed for up to 3 days. 12 tablet Ariany Kesselman, Britta Mccreedy, MD   amLODipine (NORVASC) 5 MG tablet Take 1 tablet (5 mg total) by mouth daily. 30 tablet Trudi Morgenthaler, Britta Mccreedy, MD     PDMP not reviewed this encounter.   Merrilee Jansky, MD 04/16/20 1233

## 2020-04-16 NOTE — ED Triage Notes (Signed)
Swollen throat and tongue started last night, throat is scratchy and dry today.Felt like an allergic reaction. Took Zyrtec and Ibuprofen, swelling in tongue went down but throat is still swollen.Hoarseness but does not feel sick. Vaccinated

## 2020-04-22 DIAGNOSIS — H43813 Vitreous degeneration, bilateral: Secondary | ICD-10-CM | POA: Diagnosis not present

## 2020-04-22 DIAGNOSIS — Z961 Presence of intraocular lens: Secondary | ICD-10-CM | POA: Diagnosis not present

## 2020-04-22 DIAGNOSIS — H33032 Retinal detachment with giant retinal tear, left eye: Secondary | ICD-10-CM | POA: Diagnosis not present

## 2020-04-22 DIAGNOSIS — H35372 Puckering of macula, left eye: Secondary | ICD-10-CM | POA: Diagnosis not present

## 2020-04-23 DIAGNOSIS — T783XXA Angioneurotic edema, initial encounter: Secondary | ICD-10-CM | POA: Diagnosis not present

## 2020-04-23 DIAGNOSIS — I10 Essential (primary) hypertension: Secondary | ICD-10-CM | POA: Diagnosis not present

## 2020-04-24 DIAGNOSIS — Z20822 Contact with and (suspected) exposure to covid-19: Secondary | ICD-10-CM | POA: Diagnosis not present

## 2020-05-01 ENCOUNTER — Ambulatory Visit: Payer: PPO

## 2020-05-01 ENCOUNTER — Other Ambulatory Visit: Payer: Self-pay

## 2020-05-01 DIAGNOSIS — G4733 Obstructive sleep apnea (adult) (pediatric): Secondary | ICD-10-CM | POA: Diagnosis not present

## 2020-05-15 ENCOUNTER — Telehealth: Payer: Self-pay | Admitting: Pulmonary Disease

## 2020-05-15 DIAGNOSIS — G4733 Obstructive sleep apnea (adult) (pediatric): Secondary | ICD-10-CM | POA: Diagnosis not present

## 2020-05-15 NOTE — Telephone Encounter (Signed)
Call patient  Sleep study result  Date of study: 05/01/2020  Impression: Moderate obstructive sleep apnea Severe oxygen desaturations hypoxia not entirely related to sleep disordered breathing  Recommendation: Recommend CPAP therapy for moderate obstructive sleep apnea  Schedule patient for an in lab polysomnogram as it is a highly likely that oxygen supplementation will be needed  Encourage weight loss measures  Follow-up in the office 4 to 6 weeks following initiation of treatment

## 2020-05-16 NOTE — Telephone Encounter (Signed)
I called and spoke with the pt and notified of results/recs  He is agreeable to CPAP and also PSG  I ordered the PSG   Did you want him to start CPAP now or wait until PSG done?  If now, we need settings for CPAP please, thanks!

## 2020-05-16 NOTE — Telephone Encounter (Signed)
We need the in lab sleep study to guide [place] what we will be setting him up with

## 2020-05-22 DIAGNOSIS — I10 Essential (primary) hypertension: Secondary | ICD-10-CM | POA: Diagnosis not present

## 2020-06-21 ENCOUNTER — Telehealth: Payer: Self-pay | Admitting: *Deleted

## 2020-06-21 DIAGNOSIS — I10 Essential (primary) hypertension: Secondary | ICD-10-CM | POA: Diagnosis not present

## 2020-06-21 NOTE — Telephone Encounter (Signed)
Called and spoke with patient, he had his HST done 05/01/20 and based on that test it was determined he needed an in lab test (PSG), which is not scheduled to be done until 07/01/20.  Appointment for 06/24/20 cancelled and rescheduled for 07/12/20 at 10:30 am after his PSG is completed.    Dr. Wynona Neat, Just letting you know that I rescheduled your patient's appointment until 07/12/20 since he does not have his PSG test until 07/01/20.

## 2020-06-21 NOTE — Telephone Encounter (Signed)
Got it. Thank you.

## 2020-06-24 ENCOUNTER — Ambulatory Visit: Payer: PPO | Admitting: Pulmonary Disease

## 2020-07-01 ENCOUNTER — Telehealth: Payer: Self-pay | Admitting: Pulmonary Disease

## 2020-07-01 ENCOUNTER — Other Ambulatory Visit: Payer: Self-pay

## 2020-07-01 ENCOUNTER — Ambulatory Visit (HOSPITAL_BASED_OUTPATIENT_CLINIC_OR_DEPARTMENT_OTHER): Payer: PPO | Attending: Pulmonary Disease | Admitting: Pulmonary Disease

## 2020-07-01 DIAGNOSIS — G4733 Obstructive sleep apnea (adult) (pediatric): Secondary | ICD-10-CM

## 2020-07-01 NOTE — Telephone Encounter (Signed)
Spoke with Dennis Smith  He states is appears that CPAP titration is what type of study pt needs  Spoke with Dr Maple Hudson and per Dr Maple Hudson he agrees and need to eval to see if pt needs o2  New order placed and nothing further needed

## 2020-07-01 NOTE — Telephone Encounter (Signed)
LMTCB for Dennis Smith   

## 2020-07-01 NOTE — Telephone Encounter (Signed)
Dennis Smith called back. I let him know we would call him back.

## 2020-07-02 ENCOUNTER — Other Ambulatory Visit (HOSPITAL_BASED_OUTPATIENT_CLINIC_OR_DEPARTMENT_OTHER): Payer: Self-pay

## 2020-07-02 DIAGNOSIS — G4733 Obstructive sleep apnea (adult) (pediatric): Secondary | ICD-10-CM

## 2020-07-08 ENCOUNTER — Telehealth: Payer: Self-pay | Admitting: Pulmonary Disease

## 2020-07-08 DIAGNOSIS — G4733 Obstructive sleep apnea (adult) (pediatric): Secondary | ICD-10-CM

## 2020-07-08 NOTE — Telephone Encounter (Signed)
Call patient  Sleep study result  Date of study: 07/01/2020  Impression: Moderate obstructive sleep apnea adequately treated with CPAP therapy  Recommendation: DME referral  Recommend CPAP therapy for moderate obstructive sleep apnea  Trial of CPAP therapy on 18 cm H2O, C-Flex of 2 with a Large size [employer]kel Full Face Mask Simplus mask and heated humidification.  Encourage weight loss measures  Follow-up in the office 4 to 6 weeks following initiation of treatment

## 2020-07-08 NOTE — Procedures (Signed)
POLYSOMNOGRAPHY  Last, First: Iann, Rodier MRN: [id number] Gender: Male Age (years): 70 Weight (lbs): 172 DOB: January 17, 1951 BMI: 25 Primary Care: No PCP Epworth Score: 6 Referring: Tomma Lightning MD Technician: Rosette Reveal Interpreting: Tomma Lightning MD Study Type: CPAP Ordered Study Type: CPAP Study date: 07/01/2020 Location: [place] CLINICAL INFORMATION Dennis Smith is a 70 year old Male and was referred to the sleep center for evaluation of G47.33 OSA: Adult and Pediatric (327.23). Indications include Hypertension (401.9), OSA.  MEDICATIONS Patient self administered medications include: N/A. Medications administered during study include No sleep medicine administered.  SLEEP STUDY TECHNIQUE The patient underwent an attended overnight polysomnography titration to assess the effects of CPAP therapy. The following variables were monitored: EEG(C4-A1, C3-A2, O1-A2, O2-A1), EOG, submental and leg EMG, ECG, oxyhemoglobin saturation by pulse oximetry, thoracic and abdominal respiratory effort belts, nasal/oral airflow by pressure sensor, body position sensor and snoring sensor. CPAP pressure was titrated to eliminate apneas, hypopneas and oxygen desaturation. Hypopneas were scored per AASM definition IB (4% desaturation)  TECHNICIAN COMMENTS Comments added by Technician: None Comments added by Scorer: N/A SLEEP ARCHITECTURE The study was initiated at 9:40:45 PM and terminated at 4:31:55 AM. Total recorded time was 411.2 minutes. EEG confirmed total sleep time was 264 minutes yielding a sleep efficiency of 64.2%%. Sleep onset after lights out was 29.4 minutes with a REM latency of 218.5 minutes. The patient spent 17.8%% of the night in stage N1 sleep, 63.8%% in stage N2 sleep, 0.0%% in stage N3 and 18.4% in REM. The Arousal Index was 14.3/hour. RESPIRATORY PARAMETERS The overall AHI was 0.5 per hour, and the RDI was 4.3 events/hour with a central apnea index of 0per hour. The  most appropriate setting of CPAP was 18 cm H2O. At this setting, the sleep efficiency was 94% and the patient was supine for 0%. The AHI was 0 events per hour, and the RDI was 0 events/hour (with 0 central events) and the arousal index was 8.1 per hour.The oxygen nadir was 88.0% during sleep.    The cumulative time under 88% oxygen saturation was 5.5 minutes  LEG MOVEMENT DATA The total leg movements were 0 with a resulting leg movement index of 0.0/hr. Associated arousal with leg movement index was 0.0/hr. CARDIAC DATA The underlying cardiac rhythm was most consistent with sinus rhythm. Mean heart rate during sleep was 72.8 bpm. Additional rhythm abnormalities include None.   IMPRESSIONS - EKG showed no cardiac abnormalities. - The patient snored with moderate snoring volume. - Moderate Oxygen Desaturation - Moderate Obstructive Sleep apnea(OSA), Optimal pressure attained. - No Significant Upper Airway Resistance Syndrome(UARS). - No significant periodic leg movements(PLMs) during sleep, no significant associated arousals. - Reduced sleep efficiency, long primary sleep latency, long REM sleep latency and no slow wave latency.   DIAGNOSIS - Moderate Obstructive Sleep Apnea (G47.33)   RECOMMENDATIONS - Trial of CPAP therapy on 18 cm H2O, C-Flex of 2 with a Large size [employer] Full Face Mask Simplus mask and heated humidification. - Avoid alcohol, sedatives and other CNS depressants that may worsen sleep apnea and disrupt normal sleep architecture. - Sleep hygiene should be reviewed to assess factors that may improve sleep quality. - Weight management and regular exercise should be initiated or continued. - Return to Sleep Center for re-evaluation after 4 weeks of therapy  [Electronically signed] 07/08/2020 04:37 AM  Virl Diamond MD NPI: [id number]

## 2020-07-09 NOTE — Telephone Encounter (Signed)
ATC patient.  LMTCB. 

## 2020-07-09 NOTE — Telephone Encounter (Signed)
Did drop intermittently but came right back up so does not need oxygen supplementation  Oxygen stayed at acceptable levels

## 2020-07-09 NOTE — Telephone Encounter (Signed)
Order for CPAP has been placed. Patient is asking about his oxygen levels during study were they ok? Stated they were low when he did HST  Please advise

## 2020-07-10 NOTE — Telephone Encounter (Signed)
LMTCB for the pt 

## 2020-07-10 NOTE — Telephone Encounter (Signed)
Spoke with the pt and notified of response per MR. He verbalized understanding and nothing further needed.

## 2020-07-12 ENCOUNTER — Ambulatory Visit: Payer: PPO | Admitting: Pulmonary Disease

## 2020-08-06 DIAGNOSIS — F17200 Nicotine dependence, unspecified, uncomplicated: Secondary | ICD-10-CM | POA: Diagnosis not present

## 2020-08-06 DIAGNOSIS — J449 Chronic obstructive pulmonary disease, unspecified: Secondary | ICD-10-CM | POA: Diagnosis not present

## 2020-08-06 DIAGNOSIS — Z8639 Personal history of other endocrine, nutritional and metabolic disease: Secondary | ICD-10-CM | POA: Diagnosis not present

## 2020-08-06 DIAGNOSIS — I10 Essential (primary) hypertension: Secondary | ICD-10-CM | POA: Diagnosis not present

## 2020-08-06 DIAGNOSIS — G4733 Obstructive sleep apnea (adult) (pediatric): Secondary | ICD-10-CM | POA: Diagnosis not present

## 2020-08-06 DIAGNOSIS — E782 Mixed hyperlipidemia: Secondary | ICD-10-CM | POA: Diagnosis not present

## 2020-08-06 DIAGNOSIS — R7303 Prediabetes: Secondary | ICD-10-CM | POA: Diagnosis not present

## 2020-11-14 ENCOUNTER — Telehealth: Payer: Self-pay | Admitting: Pulmonary Disease

## 2020-11-14 NOTE — Telephone Encounter (Signed)
Spoke to patient, who is calling for update on cpap machine. Order was placed 07/09/2020. Spoke to [facility] with [facility], who stated that they are now processing orders from the end of February. Patient should be contacted soon.  Patient is aware and voiced his understanding. Nothing further needed at this time.

## 2021-01-03 ENCOUNTER — Emergency Department (INDEPENDENT_AMBULATORY_CARE_PROVIDER_SITE_OTHER): Payer: PPO

## 2021-01-03 ENCOUNTER — Other Ambulatory Visit: Payer: Self-pay

## 2021-01-03 ENCOUNTER — Encounter: Payer: Self-pay | Admitting: Emergency Medicine

## 2021-01-03 ENCOUNTER — Emergency Department
Admission: EM | Admit: 2021-01-03 | Discharge: 2021-01-03 | Disposition: A | Discharge status: Home / Self Care | Payer: PPO | Source: Home / Self Care | Attending: Family Medicine | Admitting: Family Medicine

## 2021-01-03 DIAGNOSIS — T1490XA Injury, unspecified, initial encounter: Secondary | ICD-10-CM

## 2021-01-03 DIAGNOSIS — M25551 Pain in right hip: Secondary | ICD-10-CM

## 2021-01-03 DIAGNOSIS — S72041A Displaced fracture of base of neck of right femur, initial encounter for closed fracture: Secondary | ICD-10-CM | POA: Diagnosis not present

## 2021-01-03 DIAGNOSIS — Z96642 Presence of left artificial hip joint: Secondary | ICD-10-CM | POA: Diagnosis not present

## 2021-01-03 DIAGNOSIS — S72001A Fracture of unspecified part of neck of right femur, initial encounter for closed fracture: Secondary | ICD-10-CM | POA: Diagnosis not present

## 2021-01-03 MED ORDER — HYDROCODONE-ACETAMINOPHEN 5-325 MG PO TABS: 1.0000 | ORAL_TABLET | Freq: Four times a day (QID) | ORAL | 0 refills | Status: DC | PRN

## 2021-01-03 NOTE — ED Provider Notes (Signed)
[facility]    CSN: [id number] Arrival date & time: 01/03/21  1019      History   Chief Complaint Chief Complaint  Patient presents with   Hip Pain    HPI Dennis Smith is a 70 y.o. male.   Patient reports that he fell on his right hip yesterday, resulting in persistent pain in his hip area.  He has been using a walker since then. He has a past history of bilateral hip replacements.   Hip Pain This is a new problem. The current episode started yesterday. The problem occurs constantly. Exacerbated by: weight bearing. Nothing relieves the symptoms. Treatments tried: ibuprofen. The treatment provided no relief.   Past Medical History:  Diagnosis Date   Hyperlipidemia    Hypertension     Patient Active Problem List   Diagnosis Date Noted   Periprosthetic fracture of shaft of femur 11/08/2016    Past Surgical History:  Procedure Laterality Date   BACK SURGERY     JOINT REPLACEMENT     ORIF FEMUR FRACTURE Left 11/11/2016   Procedure: OPEN REDUCTION INTERNAL FIXATION (ORIF) LEFT FEMUR FRACTURE AND ARTHOTOMY;  Surgeon: Gean Birchwood, MD;  Location: [facility] OR;  Service: Orthopedics;  Laterality: Left;       Home Medications    Prior to Admission medications   Medication Sig Start Date End Date Taking? Authorizing Provider  amLODipine (NORVASC) 5 MG tablet Take 1 tablet (5 mg total) by mouth daily. 04/16/20  Yes Lamptey, Britta Mccreedy, MD  Ascorbic Acid (VITAMIN C PO) Take 1 tablet by mouth every other day.   Yes [provider]  aspirin EC 325 MG tablet Take 1 tablet (325 mg total) by mouth 2 (two) times daily. 11/11/16  Yes Dannielle Burn K, PA-C  atorvastatin (LIPITOR) 10 MG tablet Take 10 mg by mouth daily.   Yes [provider]  calcium-vitamin D (OSCAL WITH D) 500-200 MG-UNIT tablet Take 1 tablet by mouth.   Yes [provider]  cetirizine (ZYRTEC) 10 MG tablet Take 10 mg by mouth daily.   Yes [provider]  CINNAMON PO Take  1 capsule by mouth daily.   Yes [provider]  Cyanocobalamin (VITAMIN B-12 PO) Take 1 tablet by mouth daily.   Yes [provider]  hydrochlorothiazide (HYDRODIURIL) 25 MG tablet Take 25 mg by mouth every morning.  09/15/16  Yes [provider]  HYDROcodone-acetaminophen (NORCO/VICODIN) 5-325 MG tablet Take 1 tablet by mouth every 6 (six) hours as needed for moderate pain or severe pain. 01/03/21  Yes Lattie Haw, MD  methocarbamol (ROBAXIN) 500 MG tablet Take 1 tablet (500 mg total) by mouth 2 (two) times daily with a meal. 11/11/16  Yes Allena Katz, PA-C  metoprolol succinate (TOPROL-XL) 50 MG 24 hr tablet Take 75 mg by mouth daily. 12/16/20  Yes [provider]  Omega-3 Fatty Acids (FISH OIL PO) Take 1 capsule by mouth daily with supper.   Yes [provider]  omeprazole (PRILOSEC) 20 MG capsule Take 20 mg by mouth daily before breakfast. 10/17/15  Yes [provider]  TURMERIC PO Take 1 capsule by mouth daily.   Yes [provider]  diphenhydrAMINE (BENADRYL) 25 MG tablet Take 1 tablet (25 mg total) by mouth every 6 (six) hours as needed for up to 3 days. 04/16/20 04/19/20  Merrilee Jansky, MD  lisinopril (PRINIVIL,ZESTRIL) 20 MG tablet Take 20 mg by mouth every morning.  09/15/16 04/16/20  [provider]    Family History Family History  Problem Relation Age of Onset   Heart attack Father     Social History Social History   Tobacco Use   Smoking status: Every Day    Packs/day: 1.00    Years: 40.00    Pack years: 40.00    Types: Cigarettes   Smokeless tobacco: Never  Substance Use Topics   Alcohol use: Yes    Alcohol/week: 8.0 standard drinks    Types: 8 Glasses of wine per week   Drug use: No     Allergies   Lisinopril   Review of Systems Review of Systems  Constitutional:  Positive for activity change. Negative for chills, diaphoresis, fatigue and fever.  Musculoskeletal:  Positive for  gait problem. Negative for back pain.       Right hip pain.  All other systems reviewed and are negative.   Physical Exam Triage Vital Signs ED Triage Vitals  Enc Vitals Group     BP 01/03/21 1122 (!) 165/76     Pulse Rate 01/03/21 1122 88     Resp --      Temp 01/03/21 1122 98.8 F (37.1 C)     Temp Source 01/03/21 1122 Oral     SpO2 01/03/21 1122 97 %     Weight 01/03/21 1124 165 lb (74.8 kg)     Height 01/03/21 1124 5\' 10"  (1.778 m)     Head Circumference --      Peak Flow --      Pain Score 01/03/21 1124 5     Pain Loc --      Pain Edu? --      Excl. in GC? --    No data found.  Updated Vital Signs BP (!) 165/76 (BP Location: Right Arm)   Pulse 88   Temp 98.8 F (37.1 C) (Oral)   Ht 5\' 10"  (1.778 m)   Wt 74.8 kg   SpO2 97%   BMI 23.68 kg/m   Visual Acuity Right Eye Distance:   Left Eye Distance:   Bilateral Distance:    Right Eye Near:   Left Eye Near:    Bilateral Near:     Physical Exam Vitals and nursing note reviewed.  Constitutional:      General: He is not in acute distress. HENT:     Head: Atraumatic.  Eyes:     Pupils: Pupils are equal, round, and reactive to light.  Cardiovascular:     Rate and Rhythm: Normal rate.  Pulmonary:     Effort: Pulmonary effort is normal.  Abdominal:     Palpations: Abdomen is soft.     Tenderness: There is no abdominal tenderness.  Musculoskeletal:     Right hip: Tenderness and bony tenderness present. No deformity or crepitus. Decreased range of motion. Decreased strength.       Legs:     Comments: Right hip has tenderness to palpation laterally.  Distal neurovascular function is intact.   Skin:    General: Skin is warm and dry.     Findings: No bruising.  Neurological:     General: No focal deficit present.     Mental Status: He is alert.     UC Treatments / Results  Labs (all labs ordered are listed, but only abnormal results are displayed) Labs Reviewed - No data to  display  EKG   Radiology DG Hip Unilat W or Wo Pelvis 2-3 Views Right  Result Date: 01/03/2021 CLINICAL  DATA:  Fall onto right hip EXAM: DG HIP (WITH OR WITHOUT PELVIS) 2-3V RIGHT; RIGHT FEMUR 2 VIEWS COMPARISON:  Pelvis and femur radiographs 11/08/2016 FINDINGS: Pelvis: The patient is status post bilateral hip arthroplasty. There is a mildly displaced perihardware fracture of the proximal femur with a proximally 8 mm lateral displacement of the lateral cortex of the distal fragment. Femoroacetabular alignment is maintained. There is no evidence of hardware fracture. The left arthroplasty hardware appears intact. Femur: There is no additional fracture. Knee alignment is maintained. 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 IMPRESSION: Mildly displaced perihardware fracture around the right femoral arthroplasty hardware as above. Electronically Signed   By: Lesia Hausen M.D.   On: 01/03/2021 13:07   DG FEMUR, MIN 2 VIEWS RIGHT  Result Date: 01/03/2021 CLINICAL DATA:  Fall onto right hip EXAM: DG HIP (WITH OR WITHOUT PELVIS) 2-3V RIGHT; RIGHT FEMUR 2 VIEWS COMPARISON:  Pelvis and femur radiographs 11/08/2016 FINDINGS: Pelvis: The patient is status post bilateral hip arthroplasty. There is a mildly displaced perihardware fracture of the proximal femur with a proximally 8 mm lateral displacement of the lateral cortex of the distal fragment. Femoroacetabular alignment is maintained. There is no evidence of hardware fracture. The left arthroplasty hardware appears intact. Femur: There is no additional fracture. Knee alignment is maintained. 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 IMPRESSION: Mildly displaced perihardware fracture around the right femoral arthroplasty hardware as above. Electronically Signed   By: Lesia Hausen M.D.   On: 01/03/2021 13:07    Procedures Procedures (including critical care time)  Medications Ordered in UC Medications - No data to  display  Initial Impression / Assessment and Plan / UC Course  I have reviewed the triage vital signs and the nursing notes.  Pertinent labs & imaging results that were available during my care of the patient were reviewed by me and considered in my medical decision making (see chart for details).    Rx for Lortab (#15, no refill). Controlled Substance Prescriptions I have consulted the [place] Controlled Substances Registry for this patient, and feel the risk/benefit ratio today is favorable for proceeding with this prescription for a controlled substance.  Followup with orthopedist Dr. Turner Daniels as soon as possible.   Final Clinical Impressions(s) / UC Diagnoses   Final diagnoses:  Fracture, proximal femur, right, closed, initial encounter [facility])     Discharge Instructions      Avoid all weight bearing!  Use crutches.  If symptoms become significantly worse during the night or over the weekend, proceed to the local emergency room.    ED Prescriptions     Medication Sig Dispense Auth. Provider   HYDROcodone-acetaminophen (NORCO/VICODIN) 5-325 MG tablet Take 1 tablet by mouth every 6 (six) hours as needed for moderate pain or severe pain. 15 tablet Lattie Haw, MD         Lattie Haw, MD 01/04/21 [phone]

## 2021-01-03 NOTE — ED Triage Notes (Signed)
Patient states that he fell on his right hip yesterday.  Patient has had hip surgery in the past and wants to make sure everything is ok.  Patient has taken Ibuprofen for the pain.

## 2021-01-03 NOTE — Discharge Instructions (Addendum)
Avoid all weight bearing!  Use crutches.  If symptoms become significantly worse during the night or over the weekend, proceed to the local emergency room.

## 2021-01-07 DIAGNOSIS — Z96641 Presence of right artificial hip joint: Secondary | ICD-10-CM | POA: Diagnosis not present

## 2021-01-07 DIAGNOSIS — S72101A Unspecified trochanteric fracture of right femur, initial encounter for closed fracture: Secondary | ICD-10-CM | POA: Diagnosis not present

## 2021-01-07 DIAGNOSIS — Z96642 Presence of left artificial hip joint: Secondary | ICD-10-CM | POA: Diagnosis not present

## 2021-02-04 DIAGNOSIS — Z9889 Other specified postprocedural states: Secondary | ICD-10-CM | POA: Diagnosis not present

## 2021-02-04 DIAGNOSIS — S72101D Unspecified trochanteric fracture of right femur, subsequent encounter for closed fracture with routine healing: Secondary | ICD-10-CM | POA: Diagnosis not present

## 2021-02-07 DIAGNOSIS — I10 Essential (primary) hypertension: Secondary | ICD-10-CM | POA: Diagnosis not present

## 2021-02-07 DIAGNOSIS — R7303 Prediabetes: Secondary | ICD-10-CM | POA: Diagnosis not present

## 2021-02-07 DIAGNOSIS — Z0000 Encounter for general adult medical examination without abnormal findings: Secondary | ICD-10-CM | POA: Diagnosis not present

## 2021-02-07 DIAGNOSIS — E782 Mixed hyperlipidemia: Secondary | ICD-10-CM | POA: Diagnosis not present

## 2021-02-07 DIAGNOSIS — Z8639 Personal history of other endocrine, nutritional and metabolic disease: Secondary | ICD-10-CM | POA: Diagnosis not present

## 2021-02-07 DIAGNOSIS — R351 Nocturia: Secondary | ICD-10-CM | POA: Diagnosis not present

## 2021-02-07 DIAGNOSIS — Z1159 Encounter for screening for other viral diseases: Secondary | ICD-10-CM | POA: Diagnosis not present

## 2021-02-07 DIAGNOSIS — G4733 Obstructive sleep apnea (adult) (pediatric): Secondary | ICD-10-CM | POA: Diagnosis not present

## 2021-02-07 DIAGNOSIS — M1812 Unilateral primary osteoarthritis of first carpometacarpal joint, left hand: Secondary | ICD-10-CM | POA: Diagnosis not present

## 2021-02-07 DIAGNOSIS — G5601 Carpal tunnel syndrome, right upper limb: Secondary | ICD-10-CM | POA: Diagnosis not present

## 2021-02-07 DIAGNOSIS — R3912 Poor urinary stream: Secondary | ICD-10-CM | POA: Diagnosis not present

## 2021-02-07 DIAGNOSIS — R3915 Urgency of urination: Secondary | ICD-10-CM | POA: Diagnosis not present

## 2021-02-07 DIAGNOSIS — J439 Emphysema, unspecified: Secondary | ICD-10-CM | POA: Diagnosis not present

## 2021-02-07 DIAGNOSIS — F17200 Nicotine dependence, unspecified, uncomplicated: Secondary | ICD-10-CM | POA: Diagnosis not present

## 2021-02-13 DIAGNOSIS — M1812 Unilateral primary osteoarthritis of first carpometacarpal joint, left hand: Secondary | ICD-10-CM | POA: Diagnosis not present

## 2021-02-13 DIAGNOSIS — G5601 Carpal tunnel syndrome, right upper limb: Secondary | ICD-10-CM | POA: Diagnosis not present

## 2021-02-17 DIAGNOSIS — N289 Disorder of kidney and ureter, unspecified: Secondary | ICD-10-CM | POA: Diagnosis not present

## 2021-02-17 DIAGNOSIS — R748 Abnormal levels of other serum enzymes: Secondary | ICD-10-CM | POA: Diagnosis not present

## 2021-02-26 DIAGNOSIS — G5601 Carpal tunnel syndrome, right upper limb: Secondary | ICD-10-CM | POA: Diagnosis not present

## 2021-02-27 DIAGNOSIS — M1812 Unilateral primary osteoarthritis of first carpometacarpal joint, left hand: Secondary | ICD-10-CM | POA: Diagnosis not present

## 2021-02-27 DIAGNOSIS — G5601 Carpal tunnel syndrome, right upper limb: Secondary | ICD-10-CM | POA: Diagnosis not present

## 2021-03-04 DIAGNOSIS — M25551 Pain in right hip: Secondary | ICD-10-CM | POA: Diagnosis not present

## 2021-03-04 DIAGNOSIS — M25561 Pain in right knee: Secondary | ICD-10-CM | POA: Diagnosis not present

## 2021-03-05 DIAGNOSIS — G4733 Obstructive sleep apnea (adult) (pediatric): Secondary | ICD-10-CM | POA: Diagnosis not present

## 2021-03-06 ENCOUNTER — Telehealth: Payer: Self-pay | Admitting: Pulmonary Disease

## 2021-03-06 DIAGNOSIS — G4733 Obstructive sleep apnea (adult) (pediatric): Secondary | ICD-10-CM

## 2021-03-06 NOTE — Telephone Encounter (Signed)
Yes please  Lower pressure to 14 cm  Encourage him to try and use it, we can get a download in a few weeks to make sure that it is adequately treating his sleep apnea  We can always change pressures around to get him tolerating CPAP well

## 2021-03-06 NOTE — Telephone Encounter (Signed)
Called and spoke with patient. He stated that he received his new cpap machine yesterday and feels like the pressure is too high for him. He tried to sleep with it last night but the mask kept flying off of his face. I looked at his download and it appears his cpap machine is set at 18cm. He would like to have this lowered.   DME is [facility].   AO, please advise if you ok with lowering his pressure. Thanks!

## 2021-03-06 NOTE — Telephone Encounter (Signed)
Called and spoke with patient. He is ok with lowering the pressure to 14cm. I advised him that I would send an order over to [facility] for this change to happen, he verbalized understanding.   Order has been placed. Nothing further needed at time of call.

## 2021-03-07 ENCOUNTER — Telehealth: Payer: Self-pay | Admitting: Pulmonary Disease

## 2021-03-07 NOTE — Telephone Encounter (Signed)
This order was not put in till 5:47 it has been faxed to [facility] as of 2:59

## 2021-03-13 DIAGNOSIS — M25651 Stiffness of right hip, not elsewhere classified: Secondary | ICD-10-CM | POA: Diagnosis not present

## 2021-03-13 DIAGNOSIS — S72101D Unspecified trochanteric fracture of right femur, subsequent encounter for closed fracture with routine healing: Secondary | ICD-10-CM | POA: Diagnosis not present

## 2021-03-13 DIAGNOSIS — M1711 Unilateral primary osteoarthritis, right knee: Secondary | ICD-10-CM | POA: Diagnosis not present

## 2021-03-13 DIAGNOSIS — M6281 Muscle weakness (generalized): Secondary | ICD-10-CM | POA: Diagnosis not present

## 2021-03-18 DIAGNOSIS — S72101D Unspecified trochanteric fracture of right femur, subsequent encounter for closed fracture with routine healing: Secondary | ICD-10-CM | POA: Diagnosis not present

## 2021-03-18 DIAGNOSIS — M25651 Stiffness of right hip, not elsewhere classified: Secondary | ICD-10-CM | POA: Diagnosis not present

## 2021-03-18 DIAGNOSIS — M6281 Muscle weakness (generalized): Secondary | ICD-10-CM | POA: Diagnosis not present

## 2021-03-19 DIAGNOSIS — R350 Frequency of micturition: Secondary | ICD-10-CM | POA: Diagnosis not present

## 2021-03-19 DIAGNOSIS — R351 Nocturia: Secondary | ICD-10-CM | POA: Diagnosis not present

## 2021-03-19 DIAGNOSIS — R3912 Poor urinary stream: Secondary | ICD-10-CM | POA: Diagnosis not present

## 2021-03-24 DIAGNOSIS — S72101D Unspecified trochanteric fracture of right femur, subsequent encounter for closed fracture with routine healing: Secondary | ICD-10-CM | POA: Diagnosis not present

## 2021-03-24 DIAGNOSIS — M6281 Muscle weakness (generalized): Secondary | ICD-10-CM | POA: Diagnosis not present

## 2021-03-24 DIAGNOSIS — M25651 Stiffness of right hip, not elsewhere classified: Secondary | ICD-10-CM | POA: Diagnosis not present

## 2021-03-26 DIAGNOSIS — M25651 Stiffness of right hip, not elsewhere classified: Secondary | ICD-10-CM | POA: Diagnosis not present

## 2021-03-26 DIAGNOSIS — M6281 Muscle weakness (generalized): Secondary | ICD-10-CM | POA: Diagnosis not present

## 2021-03-26 DIAGNOSIS — S72101D Unspecified trochanteric fracture of right femur, subsequent encounter for closed fracture with routine healing: Secondary | ICD-10-CM | POA: Diagnosis not present

## 2021-04-01 DIAGNOSIS — M6281 Muscle weakness (generalized): Secondary | ICD-10-CM | POA: Diagnosis not present

## 2021-04-01 DIAGNOSIS — S72101D Unspecified trochanteric fracture of right femur, subsequent encounter for closed fracture with routine healing: Secondary | ICD-10-CM | POA: Diagnosis not present

## 2021-04-01 DIAGNOSIS — M25651 Stiffness of right hip, not elsewhere classified: Secondary | ICD-10-CM | POA: Diagnosis not present

## 2021-04-03 DIAGNOSIS — M25651 Stiffness of right hip, not elsewhere classified: Secondary | ICD-10-CM | POA: Diagnosis not present

## 2021-04-03 DIAGNOSIS — M6281 Muscle weakness (generalized): Secondary | ICD-10-CM | POA: Diagnosis not present

## 2021-04-03 DIAGNOSIS — S72101D Unspecified trochanteric fracture of right femur, subsequent encounter for closed fracture with routine healing: Secondary | ICD-10-CM | POA: Diagnosis not present

## 2021-04-04 DIAGNOSIS — G4733 Obstructive sleep apnea (adult) (pediatric): Secondary | ICD-10-CM | POA: Diagnosis not present

## 2021-04-17 ENCOUNTER — Other Ambulatory Visit: Payer: Self-pay

## 2021-04-17 ENCOUNTER — Ambulatory Visit: Payer: PPO | Admitting: Pulmonary Disease

## 2021-04-17 ENCOUNTER — Encounter: Payer: Self-pay | Admitting: Pulmonary Disease

## 2021-04-17 VITALS — BP 116/68 | HR 76 | Temp 97.5°F | Ht 70.0 in | Wt 166.0 lb

## 2021-04-17 DIAGNOSIS — Z9989 Dependence on other enabling machines and devices: Secondary | ICD-10-CM | POA: Diagnosis not present

## 2021-04-17 DIAGNOSIS — G4733 Obstructive sleep apnea (adult) (pediatric): Secondary | ICD-10-CM | POA: Diagnosis not present

## 2021-04-17 NOTE — Progress Notes (Signed)
[facility]    [id number]    22-May-1950  Primary Care Physician:Williams, Karis Juba, PA-C  Referring Physician: Roger Kill, PA-C [address]  [place] [address]  Chief complaint:   Patient being seen for snoring, witnessed apneas, nonrestorative sleep, frequent awakenings  HPI:  Diagnosed with moderate obstructive sleep apnea Has been using CPAP nightly  Had some difficulty tolerating CPAP initially but has been doing well  Sleeps about 7 hours, wakes up feeling like is at a good nights rest No significant daytime sleepiness  Usually goes to bed about 10 PM Falls asleep soon after Wakes up several times during the night Final wake up time between 6 and 6:30 AM Weight has been relatively stable  No family history of obstructive sleep apnea Denies significant dryness, occasionally does have dryness No headaches in the morning Occasional night sweats  Has only ever had gasping respirations months Memory is good Obstructive sleep apnea Family  History of high blood pressure, history of asthma  Active smoker, half pack a day  Outpatient Encounter Medications as of 04/17/2021  Medication Sig   amLODipine (NORVASC) 5 MG tablet Take 1 tablet (5 mg total) by mouth daily.   Ascorbic Acid (VITAMIN C PO) Take 1 tablet by mouth every other day.   aspirin EC 325 MG tablet Take 1 tablet (325 mg total) by mouth 2 (two) times daily.   atorvastatin (LIPITOR) 10 MG tablet Take 10 mg by mouth daily.   calcium-vitamin D (OSCAL WITH D) 500-200 MG-UNIT tablet Take 1 tablet by mouth.   cetirizine (ZYRTEC) 10 MG tablet Take 10 mg by mouth daily.   CINNAMON PO Take 1 capsule by mouth daily.   Cyanocobalamin (VITAMIN B-12 PO) Take 1 tablet by mouth daily.   methocarbamol (ROBAXIN) 500 MG tablet Take 1 tablet (500 mg total) by mouth 2 (two) times daily with a meal.   metoprolol succinate (TOPROL-XL) 50 MG 24 hr tablet Take 75 mg by mouth daily.    Omega-3 Fatty Acids (FISH OIL PO) Take 1 capsule by mouth daily with supper.   omeprazole (PRILOSEC) 20 MG capsule Take 20 mg by mouth daily before breakfast.   tamsulosin (FLOMAX) 0.4 MG CAPS capsule Take 0.4 mg by mouth daily.   TURMERIC PO Take 1 capsule by mouth daily.   diphenhydrAMINE (BENADRYL) 25 MG tablet Take 1 tablet (25 mg total) by mouth every 6 (six) hours as needed for up to 3 days.   [DISCONTINUED] hydrochlorothiazide (HYDRODIURIL) 25 MG tablet Take 25 mg by mouth every morning.  (Patient not taking: Reported on 04/17/2021)   [DISCONTINUED] HYDROcodone-acetaminophen (NORCO/VICODIN) 5-325 MG tablet Take 1 tablet by mouth every 6 (six) hours as needed for moderate pain or severe pain. (Patient not taking: Reported on 04/17/2021)   [DISCONTINUED] lisinopril (PRINIVIL,ZESTRIL) 20 MG tablet Take 20 mg by mouth every morning.    No facility-administered encounter medications on file as of 04/17/2021.    Allergies as of 04/17/2021 - Review Complete 04/17/2021  Allergen Reaction Noted   Lisinopril Swelling 04/23/2020    Past Medical History:  Diagnosis Date   Hyperlipidemia    Hypertension     Past Surgical History:  Procedure Laterality Date   BACK SURGERY     JOINT REPLACEMENT     ORIF FEMUR FRACTURE Left 11/11/2016   Procedure: OPEN REDUCTION INTERNAL FIXATION (ORIF) LEFT FEMUR FRACTURE AND ARTHOTOMY;  Surgeon: Gean Birchwood, MD;  Location: [facility] OR;  Service: Orthopedics;  Laterality: Left;    Family History  Problem Relation Age of Onset   Heart attack Father     Social History   Socioeconomic History   Marital status: Married    Spouse name: Not on file   Number of children: Not on file   Years of education: Not on file   Highest education level: Not on file  Occupational History   Not on file  Tobacco Use   Smoking status: Every Day    Packs/day: 1.00    Years: 40.00    Pack years: 40.00    Types: Cigarettes   Smokeless tobacco: Never  Substance and  Sexual Activity   Alcohol use: Yes    Alcohol/week: 8.0 standard drinks    Types: 8 Glasses of wine per week   Drug use: No   Sexual activity: Yes  Other Topics Concern   Not on file  Social History Narrative   Not on file   Social Determinants of Health   Financial Resource Strain: Not on file  Food Insecurity: Not on file  Transportation Needs: Not on file  Physical Activity: Not on file  Stress: Not on file  Social Connections: Not on file  Intimate Partner Violence: Not on file    Review of Systems  Respiratory:  Positive for apnea.   Psychiatric/Behavioral:  Positive for sleep disturbance.    Vitals:   04/17/21 1419  BP: 116/68  Pulse: 76  Temp: (!) 97.5 F (36.4 C)  SpO2: 97%     Physical Exam Constitutional:      Appearance: Normal appearance.  HENT:     Head: Normocephalic.     Mouth/Throat:     Mouth: Mucous membranes are moist.     Comments: Mallampati two Eyes:     General:        Right eye: No discharge.        Left eye: No discharge.     Pupils: Pupils are equal, round, and reactive to light.  Cardiovascular:     Rate and Rhythm: Normal rate and regular rhythm.     Heart sounds: No murmur heard.   No friction rub.  Pulmonary:     Effort: No respiratory distress.     Breath sounds: No stridor. No wheezing.  Chest:     Chest wall: No tenderness.  Musculoskeletal:     Cervical back: No rigidity or tenderness.  Neurological:     Mental Status: He is alert.  Psychiatric:        Mood and Affect: Mood normal.   Results of the Epworth flowsheet 04/01/2020  Sitting and reading 0  Watching TV 2  Sitting, inactive in a public place (e.g. a theatre or a meeting) 0  As a passenger in a car for an hour without a break 0  Lying down to rest in the afternoon when circumstances permit 2  Sitting and talking to someone 0  Sitting quietly after a lunch without alcohol 0  In a car, while stopped for a few minutes in traffic 0  Total score 4     Data Reviewed: Referral records reviewed  Sleep study results reviewed with the patient Compliance data reviewed with the patient showing 97% compliance Average use of 7 hours 17 minutes On CPAP of 18 Residual AHI 1.4  Assessment: Moderate obstructive sleep apnea -On CPAP therapy  Nonrestorative sleep -Significant improvement  Nicotine dependence   Plan/Recommendations: I will see you back in about a year  Continue CPAP on a nightly basis  Smoking cessation counseling  Follow-up a year from now   Virl Diamond MD [facility] 04/17/2021, 2:36 PM  CC: Roger Kill, *

## 2021-04-17 NOTE — Patient Instructions (Signed)
Severe obstructive sleep moderately severe obstructive sleep apnea adequately treated with CPAP therapy  Continue using CPAP nightly  Call with any significant concerns  I will see you a year from now

## 2021-09-22 ENCOUNTER — Emergency Department (INDEPENDENT_AMBULATORY_CARE_PROVIDER_SITE_OTHER)
Admission: EM | Admit: 2021-09-22 | Discharge: 2021-09-22 | Disposition: A | Discharge status: Home / Self Care | Payer: PPO | Source: Home / Self Care

## 2021-09-22 DIAGNOSIS — H1031 Unspecified acute conjunctivitis, right eye: Secondary | ICD-10-CM | POA: Diagnosis not present

## 2021-09-22 MED ORDER — NEOMYCIN-POLYMYXIN-DEXAMETH 3.5-10000-0.1 OP SUSP: 1.0000 [drp] | OPHTHALMIC | 0 refills | Status: AC

## 2021-09-22 NOTE — Discharge Instructions (Signed)
Use a warm washcloth to clean eye Use the antibiotic eyedrops as directed May use in addition a liquid tears drop for comfort See your eye doctor if not improving in a couple of days

## 2021-09-22 NOTE — ED Provider Notes (Signed)
[facility]    CSN: [id number] Arrival date & time: 09/22/21  1410      History   Chief Complaint Chief Complaint  Patient presents with   Conjunctivitis    RT    HPI Dennis Smith is a 71 y.o. male.   HPI  Patient is here for eye infection.  He states he has allergies and frequently gets itchy eyes, but his right eye has become progressively more red, swollen, and purulent.  This morning it was crusted shut.  His vision is normal.  Denies any trouble.  Denies any foreign body sensation.  Does not wear contact lenses  Past Medical History:  Diagnosis Date   Hyperlipidemia    Hypertension     Patient Active Problem List   Diagnosis Date Noted   Periprosthetic fracture of shaft of femur 11/08/2016    Past Surgical History:  Procedure Laterality Date   BACK SURGERY     JOINT REPLACEMENT     ORIF FEMUR FRACTURE Left 11/11/2016   Procedure: OPEN REDUCTION INTERNAL FIXATION (ORIF) LEFT FEMUR FRACTURE AND ARTHOTOMY;  Surgeon: Frederik Pear, MD;  Location: [place];  Service: Orthopedics;  Laterality: Left;       Home Medications    Prior to Admission medications   Medication Sig Start Date End Date Taking? Authorizing Provider  neomycin-polymyxin b-dexamethasone (MAXITROL) 3.5-10000-0.1 SUSP Place 1 drop into both eyes every 4 (four) hours for 5 days. 09/22/21 09/27/21 Yes Raylene Everts, MD  amLODipine (NORVASC) 5 MG tablet Take 1 tablet (5 mg total) by mouth daily. 04/16/20   LampteyMyrene Galas, MD  Ascorbic Acid (VITAMIN C PO) Take 1 tablet by mouth every other day.    [provider]  aspirin EC 325 MG tablet Take 1 tablet (325 mg total) by mouth 2 (two) times daily. 11/11/16   Leighton Parody, PA-C  atorvastatin (LIPITOR) 10 MG tablet Take 10 mg by mouth daily.    [provider]  calcium-vitamin D (OSCAL WITH D) 500-200 MG-UNIT tablet Take 1 tablet by mouth.    [provider]  cetirizine (ZYRTEC) 10 MG tablet Take 10 mg by  mouth daily.    [provider]  CINNAMON PO Take 1 capsule by mouth daily.    [provider]  Cyanocobalamin (VITAMIN B-12 PO) Take 1 tablet by mouth daily.    [provider]  diphenhydrAMINE (BENADRYL) 25 MG tablet Take 1 tablet (25 mg total) by mouth every 6 (six) hours as needed for up to 3 days. 04/16/20 04/19/20  Chase Picket, MD  methocarbamol (ROBAXIN) 500 MG tablet Take 1 tablet (500 mg total) by mouth 2 (two) times daily with a meal. 11/11/16   Leighton Parody, PA-C  metoprolol succinate (TOPROL-XL) 50 MG 24 hr tablet Take 75 mg by mouth daily. 12/16/20   [provider]  Omega-3 Fatty Acids (FISH OIL PO) Take 1 capsule by mouth daily with supper.    [provider]  omeprazole (PRILOSEC) 20 MG capsule Take 20 mg by mouth daily before breakfast. 10/17/15   [provider]  tamsulosin (FLOMAX) 0.4 MG CAPS capsule Take 0.4 mg by mouth daily. 04/14/21   [provider]  TURMERIC PO Take 1 capsule by mouth daily.    [provider]  lisinopril (PRINIVIL,ZESTRIL) 20 MG tablet Take 20 mg by mouth every morning.  09/15/16 04/16/20  [provider]    Family History Family History  Problem Relation Age of  Onset   Heart attack Father     Social History Social History   Tobacco Use   Smoking status: Every Day    Packs/day: 1.00    Years: 40.00    Pack years: 40.00    Types: Cigarettes   Smokeless tobacco: Never  Substance Use Topics   Alcohol use: Yes    Alcohol/week: 8.0 standard drinks    Types: 8 Glasses of wine per week   Drug use: No     Allergies   Lisinopril   Review of Systems Review of Systems  See HPI Physical Exam Triage Vital Signs ED Triage Vitals  Enc Vitals Group     BP 09/22/21 1424 (!) 143/63     Pulse Rate 09/22/21 1424 81     Resp 09/22/21 1424 18     Temp 09/22/21 1424 98.3 F (36.8 C)     Temp Source 09/22/21 1424 Oral     SpO2 09/22/21 1424 96 %     Weight  --      Height --      Head Circumference --      Peak Flow --      Pain Score 09/22/21 1425 2     Pain Loc --      Pain Edu? --      Excl. in [place]? --    No data found.  Updated Vital Signs BP (!) 143/63 (BP Location: Right Arm)   Pulse 81   Temp 98.3 F (36.8 C) (Oral)   Resp 18   SpO2 96%       Physical Exam Constitutional:      General: He is not in acute distress.    Appearance: He is well-developed.  HENT:     Head: Normocephalic and atraumatic.  Eyes:     Pupils: Pupils are equal, round, and reactive to light.   Cardiovascular:     Rate and Rhythm: Normal rate.  Pulmonary:     Effort: Pulmonary effort is normal. No respiratory distress.  Abdominal:     General: There is no distension.     Palpations: Abdomen is soft.  Musculoskeletal:        General: Normal range of motion.     Cervical back: Normal range of motion.  Skin:    General: Skin is warm and dry.  Neurological:     General: No focal deficit present.     Mental Status: He is alert.  Psychiatric:        Mood and Affect: Mood normal.        Behavior: Behavior normal.     UC Treatments / Results  Labs (all labs ordered are listed, but only abnormal results are displayed) Labs Reviewed - No data to display  EKG   Radiology No results found.  Procedures Procedures (including critical care time)  Medications Ordered in UC Medications - No data to display  Initial Impression / Assessment and Plan / UC Course  I have reviewed the triage vital signs and the nursing notes.  Pertinent labs & imaging results that were available during my care of the patient were reviewed by me and considered in my medical decision making (see chart for details).     With the degree of infection the mild photophobia we will give him a steroid antibiotic combination with strict instructions to see his primary ophthalmology eye doctor if not better in 2 to 3 days Final Clinical Impressions(s) / UC Diagnoses    Final diagnoses:  Acute bacterial conjunctivitis of right eye     Discharge Instructions      Use a warm washcloth to clean eye Use the antibiotic eyedrops as directed May use in addition a liquid tears drop for comfort See your eye doctor if not improving in a couple of days    ED Prescriptions     Medication Sig Dispense Auth. Provider   neomycin-polymyxin b-dexamethasone (MAXITROL) 3.5-10000-0.1 SUSP Place 1 drop into both eyes every 4 (four) hours for 5 days. 1.5 mL Raylene Everts, MD      PDMP not reviewed this encounter.   Raylene Everts, MD 09/22/21 [phone]

## 2021-09-22 NOTE — ED Triage Notes (Signed)
Pt c/o RT eye redness and drainage since yesterday. Itchy and woke up to it crusted shut. Warm compresses prn.

## 2022-03-21 NOTE — Progress Notes (Unsigned)
Cardiology Office Note:    Date:  03/21/2022   ID:  Levander Campion, DOB 1951/02/07, MRN [id number]  PCP:  Bernita Buffy   [facility] Providers Cardiologist:  None   Referring MD: Roger Kill, *    History of Present Illness:    Dennis Smith is a 71 y.o. male with a hx of HTN, history of tobacco abuse, OSA on CPAP, coronary artery Ca on CT chest, and HLD who was referred by Rueben Bash for abnormal heart rhythm.  Today, ***  Past Medical History:  Diagnosis Date   Hyperlipidemia    Hypertension     Past Surgical History:  Procedure Laterality Date   BACK SURGERY     JOINT REPLACEMENT     ORIF FEMUR FRACTURE Left 11/11/2016   Procedure: OPEN REDUCTION INTERNAL FIXATION (ORIF) LEFT FEMUR FRACTURE AND ARTHOTOMY;  Surgeon: Gean Birchwood, MD;  Location: [facility];  Service: Orthopedics;  Laterality: Left;    Current Medications: No outpatient medications have been marked as taking for the 03/23/22 encounter (Appointment) with Meriam Sprague, MD.     Allergies:   Lisinopril   Social History   Socioeconomic History   Marital status: Married    Spouse name: Not on file   Number of children: Not on file   Years of education: Not on file   Highest education level: Not on file  Occupational History   Not on file  Tobacco Use   Smoking status: Every Day    Packs/day: 1.00    Years: 40.00    Total pack years: 40.00    Types: Cigarettes   Smokeless tobacco: Never  Substance and Sexual Activity   Alcohol use: Yes    Alcohol/week: 8.0 standard drinks of alcohol    Types: 8 Glasses of wine per week   Drug use: No   Sexual activity: Yes  Other Topics Concern   Not on file  Social History Narrative   Not on file   Social Determinants of Health   Financial Resource Strain: Not on file  Food Insecurity: Not on file  Transportation Needs: Not on file  Physical Activity: Not on file  Stress: Not on file  Social  Connections: Not on file     Family History: The patient's ***family history includes Heart attack in his father.  ROS:   Please see the history of present illness.    *** All other systems reviewed and are negative.  EKGs/Labs/Other Studies Reviewed:    The following studies were reviewed today: CT chest 02/2022: FINDINGS:   LUNG NODULES: Bilateral pulmonary micronodules are new compared to CT  chest dated 02/24/2021. For example: 4 mm nodule in the right upper lobe  (series 3 image 60). A luster of micronodules in the posterior left upper  lobe (series 3 image 55), measuring 1-2 mm. 2 mm juxtapleural nodules in  the right upper lobe (series 3 image 61, 70).   LUNGS: Moderate centrilobular emphysema. No focal consolidation. Diffuse  bronchial wall thickening noted.   PLEURA: No pleural thickening or effusion.   HEART: Heart size normal. No pericardial effusion.   CORONARY ARTERY CALCIFICATION: Moderate.   MEDIASTINUM/HILUM/AXILLA: No adenopathy. Normal esophagus.   OTHER FINDINGS: There are calcified PDA, unchanged calcification versus  surgical material abutting the portal vein, left renal cortical scarring  and mild degenerative changes.   EKG:  EKG is *** ordered today.  The ekg ordered today demonstrates ***  Recent Labs: No results  found for requested labs within last 365 days.  Recent Lipid Panel No results found for: "CHOL", "TRIG", "HDL", "CHOLHDL", "VLDL", "LDLCALC", "LDLDIRECT"   Risk Assessment/Calculations:   {Does this patient have ATRIAL FIBRILLATION?:[phone]}  No BP recorded.  {Refresh Note OR Click here to enter BP  :1}***         Physical Exam:    VS:  There were no vitals taken for this visit.    Wt Readings from Last 3 Encounters:  04/17/21 166 lb (75.3 kg)  01/03/21 165 lb (74.8 kg)  07/01/20 172 lb (78 kg)     GEN: *** Well nourished, well developed in no acute distress HEENT: Normal NECK: No JVD; No carotid bruits LYMPHATICS:  No lymphadenopathy CARDIAC: ***RRR, no murmurs, rubs, gallops RESPIRATORY:  Clear to auscultation without rales, wheezing or rhonchi  ABDOMEN: Soft, non-tender, non-distended MUSCULOSKELETAL:  No edema; No deformity  SKIN: Warm and dry NEUROLOGIC:  Alert and oriented x 3 PSYCHIATRIC:  Normal affect   ASSESSMENT:    No diagnosis found. PLAN:    In order of problems listed above:  #Abnormal ECG?  #CAD with Ca on CT chest: No anginal symptoms. Will continue with medical therapy and lifestyle modifications  #HTN: -Continue amlodipine 5mg  daily  #HLD: -Continue lipitor 10mg  daily -Goal LDL<70; currently 31 (will work on lifestyle)  #Tobacco Abuse: Continues to smoke 3/4 ppd. Not interested in quitting at this time. Encouraged cessation. -Encouraged cessation -Annual CT chest for lung cancer screening      {Are you ordering a CV Procedure (e.g. stress test, cath, DCCV, TEE, etc)?   Press F2        :    Medication Adjustments/Labs and Tests Ordered: Current medicines are reviewed at length with the patient today.  Concerns regarding medicines are outlined above.  No orders of the defined types were placed in this encounter.  No orders of the defined types were placed in this encounter.   There are no Patient Instructions on file for this visit.   Signed, 65, MD  03/21/2022 2:34 PM    [facility]

## 2022-03-23 ENCOUNTER — Telehealth: Payer: Self-pay | Admitting: *Deleted

## 2022-03-23 ENCOUNTER — Ambulatory Visit: Payer: PPO | Attending: Cardiology | Admitting: Cardiology

## 2022-03-23 ENCOUNTER — Ambulatory Visit: Payer: PPO | Attending: Cardiology

## 2022-03-23 ENCOUNTER — Encounter: Payer: Self-pay | Admitting: Cardiology

## 2022-03-23 ENCOUNTER — Encounter: Payer: Self-pay | Admitting: *Deleted

## 2022-03-23 VITALS — BP 132/74 | HR 75 | Ht 70.0 in | Wt 178.0 lb

## 2022-03-23 DIAGNOSIS — R002 Palpitations: Secondary | ICD-10-CM | POA: Diagnosis not present

## 2022-03-23 DIAGNOSIS — I10 Essential (primary) hypertension: Secondary | ICD-10-CM

## 2022-03-23 DIAGNOSIS — I25119 Atherosclerotic heart disease of native coronary artery with unspecified angina pectoris: Secondary | ICD-10-CM

## 2022-03-23 DIAGNOSIS — E782 Mixed hyperlipidemia: Secondary | ICD-10-CM

## 2022-03-23 DIAGNOSIS — R0609 Other forms of dyspnea: Secondary | ICD-10-CM | POA: Diagnosis not present

## 2022-03-23 DIAGNOSIS — Z720 Tobacco use: Secondary | ICD-10-CM

## 2022-03-23 NOTE — Telephone Encounter (Signed)
-----   Message from Ernst Bowler sent at 03/23/2022  1:05 PM EST ----- Regarding: RE: 7 DAY ZIO PER DR. Shari Prows done ----- Message ----- From: Loa Socks, LPN Sent: [phone]/[id number]  11:42 AM EST To: Loa Socks, LPN; Shelly A Wells; # Subject: 7 DAY ZIO PER DR. Shari Prows                    7 day zio ordered by Dr. Shari Prows  Please enroll and let me know  Thanks Lajoyce Corners

## 2022-03-23 NOTE — Progress Notes (Unsigned)
Enrolled for [employer] to mail a ZIO XT long term holter monitor to the patients address on file.  

## 2022-03-23 NOTE — Progress Notes (Signed)
Cardiology Office Note:    Date:  03/23/2022   ID:  Dennis Smith, DOB 19-Sep-1950, MRN [id number]  PCP:  Bernita Buffy   [facility] Providers Cardiologist:  None   Referring MD: Roger Kill, *    History of Present Illness:    Dennis Smith is a 71 y.o. male with a hx of HTN, history of tobacco abuse, OSA on CPAP, coronary artery Ca on CT chest, and HLD who was referred by Rueben Bash for abnormal heart rhythm.  Today, the patient states that he feels okay. Reports that he had an episode of an irregular heart rhythm after eating fish where he thought he may have been allergic to. Specifically, he started to feel flushed in the face and developed a rapid, irregular heart beat. He also experienced associated SOB which he describes as feeling like he had to force his breathing. He put on his wife's apple watch and it notified him of an "arhythmia." At home he sat down and rested for the remainder of the night. These symptoms lasted for at least several hours until he went to bed, at which time they were less severe but still present. All of his symptoms were resolved by the next morning.  Of note, similar events have happened before, which he attributed to allergies. He now believes there is a correlation between these episodes and when he consumes certain foods or drinks wine.  He also states he is feeling more short of breath than usual. He used to go the the gym 6 days a week, but hasn't gone regularly since the summer. He notes that his SOB has worsened since no longer going to the gym. No associated exertional chest pain.  For 20 years he was on lisinopril. This was recently stopped after he developed angioedema. Additionally he confirms that he is currently taking 50 mg metoprolol.  Continues to smoke, but would like to quit. He uses his inhaler once in a while.  Father died of MI at 36 y/o and was found to have a 90% arterial blockage.    He denies any palpitations or chest pain. No lightheadedness, headaches, syncope, orthopnea, or PND.   Past Medical History:  Diagnosis Date   Hyperlipidemia    Hypertension     Past Surgical History:  Procedure Laterality Date   BACK SURGERY     JOINT REPLACEMENT     ORIF FEMUR FRACTURE Left 11/11/2016   Procedure: OPEN REDUCTION INTERNAL FIXATION (ORIF) LEFT FEMUR FRACTURE AND ARTHOTOMY;  Surgeon: Gean Birchwood, MD;  Location: [facility] OR;  Service: Orthopedics;  Laterality: Left;    Current Medications: Current Meds  Medication Sig   amLODipine (NORVASC) 5 MG tablet Take 1 tablet (5 mg total) by mouth daily.   Ascorbic Acid (VITAMIN C PO) Take 1 tablet by mouth every other day.   aspirin EC 325 MG tablet Take 1 tablet (325 mg total) by mouth 2 (two) times daily.   atorvastatin (LIPITOR) 10 MG tablet Take 10 mg by mouth daily.   calcium-vitamin D (OSCAL WITH D) 500-200 MG-UNIT tablet Take 1 tablet by mouth.   cetirizine (ZYRTEC) 10 MG tablet Take 10 mg by mouth daily.   Cholecalciferol (VITAMIN D-1000 MAX ST) 25 MCG (1000 UT) tablet Take 1,000 Units by mouth every other day.   CINNAMON PO Take 1 capsule by mouth daily.   Cyanocobalamin (VITAMIN B-12 PO) Take 1 tablet by mouth daily.   metoprolol succinate (TOPROL-XL) 50 MG  24 hr tablet Take 75 mg by mouth daily.   Omega-3 Fatty Acids (FISH OIL PO) Take 1 capsule by mouth daily with supper.   omeprazole (PRILOSEC) 20 MG capsule Take 20 mg by mouth daily before breakfast.   tamsulosin (FLOMAX) 0.4 MG CAPS capsule Take 0.4 mg by mouth daily.   TURMERIC PO Take 1 capsule by mouth daily.     Allergies:   Lisinopril   Social History   Socioeconomic History   Marital status: Married    Spouse name: Not on file   Number of children: Not on file   Years of education: Not on file   Highest education level: Not on file  Occupational History   Not on file  Tobacco Use   Smoking status: Every Day    Packs/day: 1.00    Years: 40.00     Total pack years: 40.00    Types: Cigarettes   Smokeless tobacco: Never  Substance and Sexual Activity   Alcohol use: Yes    Alcohol/week: 8.0 standard drinks of alcohol    Types: 8 Glasses of wine per week   Drug use: No   Sexual activity: Yes  Other Topics Concern   Not on file  Social History Narrative   Not on file   Social Determinants of Health   Financial Resource Strain: Not on file  Food Insecurity: Not on file  Transportation Needs: Not on file  Physical Activity: Not on file  Stress: Not on file  Social Connections: Not on file     Family History: The patient's family history includes Heart attack in his father.  ROS:    Review of Systems  Constitutional:  Negative for chills and fever.  HENT:  Negative for nosebleeds and tinnitus.   Eyes:  Negative for blurred vision and pain.  Respiratory:  Positive for shortness of breath. Negative for cough, hemoptysis and stridor.   Cardiovascular:  Negative for chest pain, palpitations, orthopnea, claudication, leg swelling and PND.  Gastrointestinal:  Negative for blood in stool, diarrhea, nausea and vomiting.  Genitourinary:  Negative for dysuria and hematuria.  Musculoskeletal:  Negative for falls.  Neurological:  Negative for dizziness, loss of consciousness and headaches.  Psychiatric/Behavioral:  Negative for depression, hallucinations and substance abuse. The patient does not have insomnia.      EKGs/Labs/Other Studies Reviewed:    The following studies were reviewed today:  CT chest 02/2022: FINDINGS:   LUNG NODULES: Bilateral pulmonary micronodules are new compared to CT  chest dated 02/24/2021. For example: 4 mm nodule in the right upper lobe  (series 3 image 60). A luster of micronodules in the posterior left upper  lobe (series 3 image 55), measuring 1-2 mm. 2 mm juxtapleural nodules in  the right upper lobe (series 3 image 61, 70).   LUNGS: Moderate centrilobular emphysema. No focal  consolidation. Diffuse  bronchial wall thickening noted.   PLEURA: No pleural thickening or effusion.   HEART: Heart size normal. No pericardial effusion.   CORONARY ARTERY CALCIFICATION: Moderate.   MEDIASTINUM/HILUM/AXILLA: No adenopathy. Normal esophagus.   OTHER FINDINGS: There are calcified PDA, unchanged calcification versus  surgical material abutting the portal vein, left renal cortical scarring  and mild degenerative changes.   EKG:  EKG is personally reviewed. 03/23/2022: Sinus rhythm. Rate 75 bpm. Nonspecific ST/T wave changes.  Recent Labs: No results found for requested labs within last 365 days.   Recent Lipid Panel No results found for: "CHOL", "TRIG", "HDL", "CHOLHDL", "VLDL", "LDLCALC", "LDLDIRECT"  Risk Assessment/Calculations:                Physical Exam:    VS:  BP 132/74   Pulse 75   Ht 5\' 10"  (1.778 m)   Wt 178 lb (80.7 kg)   SpO2 96%   BMI 25.54 kg/m     Wt Readings from Last 3 Encounters:  03/23/22 178 lb (80.7 kg)  04/17/21 166 lb (75.3 kg)  01/03/21 165 lb (74.8 kg)     GEN:  Well nourished, well developed in no acute distress HEENT: Normal NECK: No JVD; No carotid bruits CARDIAC: RRR, no murmurs, rubs, gallops RESPIRATORY:   Slight expiratory wheezing at right lung base without rales, or rhonchi  ABDOMEN: Soft, non-tender, non-distended MUSCULOSKELETAL:  No edema; No deformity  SKIN: Warm and dry NEUROLOGIC:  Alert and oriented x 3 PSYCHIATRIC:  Normal affect   ASSESSMENT:    1. Palpitations   2. Coronary artery disease involving native coronary artery of native heart with angina pectoris (HCC)   3. DOE (dyspnea on exertion)   4. Mixed hyperlipidemia   5. Primary hypertension   6. Tobacco abuse    PLAN:    In order of problems listed above:  #Palpitations: Patient with episode of irregular heart rhythm following what sounds to be an allergic reaction. Symptoms lasted several hours before resolving. No current  palpitations. Will check zio monitor to assess further but if does not capture, can consider Kardia device or apple watch. -Check 7 day zio -Continue metop 50mg  XL daily  #CAD with Ca on CT chest: Patient reports worsening DOE since stopping going to the gym. While this may be deconditioning, given that it is progressing and he has significant risk factors in the setting of known CAD, will check myoview and TTE for further evaluation. -Check myoview -Check TTE  #HTN: -Continue metoprolol 50mg  XL daily -Continue amlodipine 5mg  daily  #HLD: -Continue lipitor 10mg  daily -Goal LDL<70; currently 2573  (will work on lifestyle and adjust as needed)  #Tobacco Abuse: Continues to smoke 3/4 ppd. Not interested in quitting at this time. Encouraged cessation. -Encouraged cessation -Annual CT chest for lung cancer screening      Shared Decision Making/Informed Consent The risks [chest pain, shortness of breath, cardiac arrhythmias, dizziness, blood pressure fluctuations, myocardial infarction, stroke/transient ischemic attack, nausea, vomiting, allergic reaction, radiation exposure, metallic taste sensation and life-threatening complications (estimated to be 1 in 10,000)], benefits (risk stratification, diagnosing coronary artery disease, treatment guidance) and alternatives of a nuclear stress test were discussed in detail with Dennis Smith and he agrees to proceed.  Follow up: 6 months.  Medication Adjustments/Labs and Tests Ordered: Current medicines are reviewed at length with the patient today.  Concerns regarding medicines are outlined above.  Orders Placed This Encounter  Procedures   Cardiac Stress Test: Informed Consent Details: Physician/Practitioner Attestation; Transcribe to consent form and obtain patient signature   LONG TERM MONITOR (3-14 DAYS)   MYOCARDIAL PERFUSION IMAGING   EKG 12-Lead   ECHOCARDIOGRAM COMPLETE   No orders of the defined types were placed in this  encounter.  Patient Instructions  Medication Instructions:   Your physician recommends that you continue on your current medications as directed. Please refer to the Current Medication list given to you today.  *If you need a refill on your cardiac medications before your next appointment, please call your pharmacy*   Testing/Procedures:  Your physician has requested that you have an echocardiogram. Echocardiography is a painless test that  uses sound waves to create images of your heart. It provides your doctor with information about the size and shape of your heart and how well your heart's chambers and valves are working. This procedure takes approximately one hour. There are no restrictions for this procedure. Please do NOT wear cologne, perfume, aftershave, or lotions (deodorant is allowed). Please arrive 15 minutes prior to your appointment time.    Your physician has requested that you have a lexiscan myoview. For further information please visit [email or web address] Please follow instruction sheet, as given.    ZIO XT- Long Term Monitor Instructions  Your physician has requested you wear a ZIO patch monitor for 7 days.  This is a single patch monitor. [employer] supplies one patch monitor per enrollment. Additional stickers are not available. Please do not apply patch if you will be having a Nuclear Stress Test,  Echocardiogram, Cardiac CT, MRI, or Chest Xray during the period you would be wearing the  monitor. The patch cannot be worn during these tests. You cannot remove and re-apply the  ZIO XT patch monitor.  Your ZIO patch monitor will be mailed 3 day USPS to your address on file. It may take 3-5 days  to receive your monitor after you have been enrolled.  Once you have received your monitor, please review the enclosed instructions. Your monitor  has already been registered assigning a specific monitor serial # to you.  Billing and Patient Assistance Program  Information  We have supplied [employer] with any of your insurance information on file for billing purposes. [employer] offers a sliding scale Patient Assistance Program for patients that do not have  insurance, or whose insurance does not completely cover the cost of the ZIO monitor.  You must apply for the Patient Assistance Program to qualify for this discounted rate.  To apply, please call [employer] at [phone], select option 4, select option 2, ask to apply for  Patient Assistance Program. Meredeth Ide will ask your household income, and how many people  are in your household. They will quote your out-of-pocket cost based on that information.  [employer] will also be able to set up a 46-month, interest-free payment plan if needed.  Applying the monitor   Shave hair from upper left chest.  Hold abrader disc by orange tab. Rub abrader in 40 strokes over the upper left chest as  indicated in your monitor instructions.  Clean area with 4 enclosed alcohol pads. Let dry.  Apply patch as indicated in monitor instructions. Patch will be placed under collarbone on left  side of chest with arrow pointing upward.  Rub patch adhesive wings for 2 minutes. Remove white label marked "1". Remove the white  label marked "2". Rub patch adhesive wings for 2 additional minutes.  While looking in a mirror, press and release button in center of patch. A small green light will  flash 3-4 times. This will be your only indicator that the monitor has been turned on.  Do not shower for the first 24 hours. You may shower after the first 24 hours.  Press the button if you feel a symptom. You will hear a small click. Record Date, Time and  Symptom in the Patient Logbook.  When you are ready to remove the patch, follow instructions on the last 2 pages of Patient  Logbook. Stick patch monitor onto the last page of Patient Logbook.  Place Patient Logbook in the blue and white box. Use locking tab on box and tape box closed  securely. The blue and white box has prepaid postage on it. Please place it in the mailbox as  soon as possible. Your physician should have your test results approximately 7 days after the  monitor has been mailed back to [facility].  Call [facility] Customer Care at [phone] if you have questions regarding  your ZIO XT patch monitor. Call them immediately if you see an orange light blinking on your  monitor.  If your monitor falls off in less than 4 days, contact our Monitor department at [phone].  If your monitor becomes loose or falls off after 4 days call [employer] at [phone] for  suggestions on securing your monitor    Follow-Up: At [facility], you and your health needs are our priority.  As part of our continuing mission to provide you with exceptional heart care, we have created designated Provider Care Teams.  These Care Teams include your primary Cardiologist (physician) and Advanced Practice Providers (APPs -  Physician Assistants and Nurse Practitioners) who all work together to provide you with the care you need, when you need it.  We recommend signing up for the patient portal called "MyChart".  Sign up information is provided on this After Visit Summary.  MyChart is used to connect with patients for Virtual Visits (Telemedicine).  Patients are able to view lab/test results, encounter notes, upcoming appointments, etc.  Non-urgent messages can be sent to your provider as well.   To learn more about what you can do with MyChart, go to [email or web address].    Your next appointment:   6 month(s)  The format for your next appointment:   In Person  Provider:   DR. Shari Prows   Important Information About Sugar         I,Mitra Faeizi,acting as a scribe for Meriam Sprague, MD.,have documented all relevant documentation on the behalf of Meriam Sprague, MD,as directed by  Meriam Sprague, MD while in the presence of Meriam Sprague, MD.   I, Meriam Sprague, MD, have reviewed all documentation for this visit. The documentation on 03/23/22 for the exam, diagnosis, procedures, and orders are all accurate and complete.   Signed, Meriam Sprague, MD  03/23/2022 12:45 PM    [facility]

## 2022-03-23 NOTE — Patient Instructions (Signed)
Medication Instructions:   Your physician recommends that you continue on your current medications as directed. Please refer to the Current Medication list given to you today.  *If you need a refill on your cardiac medications before your next appointment, please call your pharmacy*   Testing/Procedures:  Your physician has requested that you have an echocardiogram. Echocardiography is a painless test that uses sound waves to create images of your heart. It provides your doctor with information about the size and shape of your heart and how well your heart's chambers and valves are working. This procedure takes approximately one hour. There are no restrictions for this procedure. Please do NOT wear cologne, perfume, aftershave, or lotions (deodorant is allowed). Please arrive 15 minutes prior to your appointment time.    Your physician has requested that you have a lexiscan myoview. For further information please visit [email or web address] Please follow instruction sheet, as given.    ZIO XT- Long Term Monitor Instructions  Your physician has requested you wear a ZIO patch monitor for 7 days.  This is a single patch monitor. [employer] supplies one patch monitor per enrollment. Additional stickers are not available. Please do not apply patch if you will be having a Nuclear Stress Test,  Echocardiogram, Cardiac CT, MRI, or Chest Xray during the period you would be wearing the  monitor. The patch cannot be worn during these tests. You cannot remove and re-apply the  ZIO XT patch monitor.  Your ZIO patch monitor will be mailed 3 day USPS to your address on file. It may take 3-5 days  to receive your monitor after you have been enrolled.  Once you have received your monitor, please review the enclosed instructions. Your monitor  has already been registered assigning a specific monitor serial # to you.  Billing and Patient Assistance Program Information  We have supplied [employer] with any of  your insurance information on file for billing purposes. [employer] offers a sliding scale Patient Assistance Program for patients that do not have  insurance, or whose insurance does not completely cover the cost of the ZIO monitor.  You must apply for the Patient Assistance Program to qualify for this discounted rate.  To apply, please call [employer] at [phone], select option 4, select option 2, ask to apply for  Patient Assistance Program. Meredeth Ide will ask your household income, and how many people  are in your household. They will quote your out-of-pocket cost based on that information.  [employer] will also be able to set up a 39-month, interest-free payment plan if needed.  Applying the monitor   Shave hair from upper left chest.  Hold abrader disc by orange tab. Rub abrader in 40 strokes over the upper left chest as  indicated in your monitor instructions.  Clean area with 4 enclosed alcohol pads. Let dry.  Apply patch as indicated in monitor instructions. Patch will be placed under collarbone on left  side of chest with arrow pointing upward.  Rub patch adhesive wings for 2 minutes. Remove white label marked "1". Remove the white  label marked "2". Rub patch adhesive wings for 2 additional minutes.  While looking in a mirror, press and release button in center of patch. A small green light will  flash 3-4 times. This will be your only indicator that the monitor has been turned on.  Do not shower for the first 24 hours. You may shower after the first 24 hours.  Press the button if you feel a symptom. You will  hear a small click. Record Date, Time and  Symptom in the Patient Logbook.  When you are ready to remove the patch, follow instructions on the last 2 pages of Patient  Logbook. Stick patch monitor onto the last page of Patient Logbook.  Place Patient Logbook in the blue and white box. Use locking tab on box and tape box closed  securely. The blue and white box has prepaid postage  on it. Please place it in the mailbox as  soon as possible. Your physician should have your test results approximately 7 days after the  monitor has been mailed back to [facility].  Call [facility] Customer Care at [phone] if you have questions regarding  your ZIO XT patch monitor. Call them immediately if you see an orange light blinking on your  monitor.  If your monitor falls off in less than 4 days, contact our Monitor department at [phone].  If your monitor becomes loose or falls off after 4 days call [employer] at [phone] for  suggestions on securing your monitor    Follow-Up: At [facility], you and your health needs are our priority.  As part of our continuing mission to provide you with exceptional heart care, we have created designated Provider Care Teams.  These Care Teams include your primary Cardiologist (physician) and Advanced Practice Providers (APPs -  Physician Assistants and Nurse Practitioners) who all work together to provide you with the care you need, when you need it.  We recommend signing up for the patient portal called "MyChart".  Sign up information is provided on this After Visit Summary.  MyChart is used to connect with patients for Virtual Visits (Telemedicine).  Patients are able to view lab/test results, encounter notes, upcoming appointments, etc.  Non-urgent messages can be sent to your provider as well.   To learn more about what you can do with MyChart, go to [email or web address].    Your next appointment:   6 month(s)  The format for your next appointment:   In Person  Provider:   DR. Shari Prows   Important Information About Sugar

## 2022-03-23 NOTE — Progress Notes (Deleted)
Cardiology Office Note:    Date:  03/23/2022   ID:  UNKOWN Smith, DOB February 05, 1951, MRN [id number]  PCP:  Dennis Smith   Dennis Smith:  None   Referring MD: Dennis Smith, *    History of Present Illness:    Dennis Smith is a 71 y.o. male with a hx of HTN, history of tobacco abuse, OSA on CPAP, coronary artery Ca on CT chest, and HLD who was referred by Dennis Smith for abnormal heart rhythm.  Today, ***  Past Medical History:  Diagnosis Date   Hyperlipidemia    Hypertension     Past Surgical History:  Procedure Laterality Date   BACK SURGERY     JOINT REPLACEMENT     ORIF FEMUR FRACTURE Left 11/11/2016   Procedure: OPEN REDUCTION INTERNAL FIXATION (ORIF) LEFT FEMUR FRACTURE AND ARTHOTOMY;  Surgeon: Frederik Pear, MD;  Location: [place];  Service: Orthopedics;  Laterality: Left;    Current Medications: Current Meds  Medication Sig   amLODipine (NORVASC) 5 MG tablet Take 1 tablet (5 mg total) by mouth daily.   Ascorbic Acid (VITAMIN C PO) Take 1 tablet by mouth every other day.   aspirin EC 325 MG tablet Take 1 tablet (325 mg total) by mouth 2 (two) times daily.   atorvastatin (LIPITOR) 10 MG tablet Take 10 mg by mouth daily.   calcium-vitamin D (OSCAL WITH D) 500-200 MG-UNIT tablet Take 1 tablet by mouth.   cetirizine (ZYRTEC) 10 MG tablet Take 10 mg by mouth daily.   Cholecalciferol (VITAMIN D-1000 MAX ST) 25 MCG (1000 UT) tablet Take 1,000 Units by mouth every other day.   CINNAMON PO Take 1 capsule by mouth daily.   Cyanocobalamin (VITAMIN B-12 PO) Take 1 tablet by mouth daily.   metoprolol succinate (TOPROL-XL) 50 MG 24 hr tablet Take 75 mg by mouth daily.   Omega-3 Fatty Acids (FISH OIL PO) Take 1 capsule by mouth daily with supper.   omeprazole (PRILOSEC) 20 MG capsule Take 20 mg by mouth daily before breakfast.   tamsulosin (FLOMAX) 0.4 MG CAPS capsule Take 0.4 mg by mouth daily.   TURMERIC PO Take 1  capsule by mouth daily.     Allergies:   Lisinopril   Social History   Socioeconomic History   Marital status: Married    Spouse name: Not on file   Number of children: Not on file   Years of education: Not on file   Highest education level: Not on file  Occupational History   Not on file  Tobacco Use   Smoking status: Every Day    Packs/day: 1.00    Years: 40.00    Total pack years: 40.00    Types: Cigarettes   Smokeless tobacco: Never  Substance and Sexual Activity   Alcohol use: Yes    Alcohol/week: 8.0 standard drinks of alcohol    Types: 8 Glasses of wine per week   Drug use: No   Sexual activity: Yes  Other Topics Concern   Not on file  Social History Narrative   Not on file   Social Determinants of Health   Financial Resource Strain: Not on file  Food Insecurity: Not on file  Transportation Needs: Not on file  Physical Activity: Not on file  Stress: Not on file  Social Connections: Not on file     Family History: The patient's ***family history includes Heart attack in his father.  ROS:   Please  see the history of present illness.    *** All other systems reviewed and are negative.  EKGs/Labs/Other Studies Reviewed:    The following studies were reviewed today: CT chest 02/2022: FINDINGS:   LUNG NODULES: Bilateral pulmonary micronodules are new compared to CT  chest dated 02/24/2021. For example: 4 mm nodule in the right upper lobe  (series 3 image 60). A luster of micronodules in the posterior left upper  lobe (series 3 image 55), measuring 1-2 mm. 2 mm juxtapleural nodules in  the right upper lobe (series 3 image 61, 70).   LUNGS: Moderate centrilobular emphysema. No focal consolidation. Diffuse  bronchial wall thickening noted.   PLEURA: No pleural thickening or effusion.   HEART: Heart size normal. No pericardial effusion.   CORONARY ARTERY CALCIFICATION: Moderate.   MEDIASTINUM/HILUM/AXILLA: No adenopathy. Normal esophagus.   OTHER  FINDINGS: There are calcified PDA, unchanged calcification versus  surgical material abutting the portal vein, left renal cortical scarring  and mild degenerative changes.   EKG:  EKG is *** ordered today.  The ekg ordered today demonstrates ***  Recent Labs: No results found for requested labs within last 365 days.  Recent Lipid Panel No results found for: "CHOL", "TRIG", "HDL", "CHOLHDL", "VLDL", "LDLCALC", "LDLDIRECT"   Risk Assessment/Calculations:   {Does this patient have ATRIAL FIBRILLATION?[phone]}            Physical Exam:    VS:  BP 132/74   Pulse 75   Ht 5\' 10"  (1.778 m)   Wt 178 lb (80.7 kg)   SpO2 96%   BMI 25.54 kg/m     Wt Readings from Last 3 Encounters:  03/23/22 178 lb (80.7 kg)  04/17/21 166 lb (75.3 kg)  01/03/21 165 lb (74.8 kg)     GEN: *** Well nourished, well developed in no acute distress HEENT: Normal NECK: No JVD; No carotid bruits LYMPHATICS: No lymphadenopathy CARDIAC: ***RRR, no murmurs, rubs, gallops RESPIRATORY:  Clear to auscultation without rales, wheezing or rhonchi  ABDOMEN: Soft, non-tender, non-distended MUSCULOSKELETAL:  No edema; No deformity  SKIN: Warm and dry NEUROLOGIC:  Alert and oriented x 3 PSYCHIATRIC:  Normal affect   ASSESSMENT:    1. Palpitations   2. Coronary artery disease involving native coronary artery of native heart with angina pectoris (HCC)   3. DOE (dyspnea on exertion)    PLAN:    In order of problems listed above:  #Abnormal ECG?  #CAD with Ca on CT chest: No anginal symptoms. Will continue with medical therapy and lifestyle modifications  #HTN: -Continue amlodipine 5mg  daily  #HLD: -Continue lipitor 10mg  daily -Goal LDL<70; currently 4 (will work on lifestyle)  #Tobacco Abuse: Continues to smoke 3/4 ppd. Not interested in quitting at this time. Encouraged cessation. -Encouraged cessation -Annual CT chest for lung cancer screening      {Are you ordering a CV Procedure (e.g.  stress test, cath, DCCV, TEE, etc)?   Press F2        :    Medication Adjustments/Labs and Tests Ordered: Current medicines are reviewed at length with the patient today.  Concerns regarding medicines are outlined above.  Orders Placed This Encounter  Procedures   LONG TERM MONITOR (3-14 DAYS)   MYOCARDIAL PERFUSION IMAGING   EKG 12-Lead   ECHOCARDIOGRAM COMPLETE    No orders of the defined types were placed in this encounter.   Patient Instructions  Medication Instructions:   Your physician recommends that you continue on your current medications as directed.  Please refer to the Current Medication list given to you today.  *If you need a refill on your cardiac medications before your next appointment, please call your pharmacy*   Testing/Procedures:  Your physician has requested that you have an echocardiogram. Echocardiography is a painless test that uses sound waves to create images of your heart. It provides your doctor with information about the size and shape of your heart and how well your heart's chambers and valves are working. This procedure takes approximately one hour. There are no restrictions for this procedure. Please do NOT wear cologne, perfume, aftershave, or lotions (deodorant is allowed). Please arrive 15 minutes prior to your appointment time.    Your physician has requested that you have a lexiscan myoview. For further information please visit [email or web address]. Please follow instruction sheet, as given.    ZIO XT- Long Term Monitor Instructions  Your physician has requested you wear a ZIO patch monitor for 7 days.  This is a single patch monitor. [employer] supplies one patch monitor per enrollment. Additional stickers are not available. Please do not apply patch if you will be having a Nuclear Stress Test,  Echocardiogram, Cardiac CT, MRI, or Chest Xray during the period you would be wearing the  monitor. The patch cannot be worn during these  tests. You cannot remove and re-apply the  ZIO XT patch monitor.  Your ZIO patch monitor will be mailed 3 day USPS to your address on file. It may take 3-5 days  to receive your monitor after you have been enrolled.  Once you have received your monitor, please review the enclosed instructions. Your monitor  has already been registered assigning a specific monitor serial # to you.  Billing and Patient Assistance Program Information  We have supplied [employer] with any of your insurance information on file for billing purposes. [employer] offers a sliding scale Patient Assistance Program for patients that do not have  insurance, or whose insurance does not completely cover the cost of the ZIO monitor.  You must apply for the Patient Assistance Program to qualify for this discounted rate.  To apply, please call [employer] at [phone], select option 4, select option 2, ask to apply for  Patient Assistance Program. Theodore Demark will ask your household income, and how many people  are in your household. They will quote your out-of-pocket cost based on that information.  [employer] will also be able to set up a 57-month, interest-free payment plan if needed.  Applying the monitor   Shave hair from upper left chest.  Hold abrader disc by orange tab. Rub abrader in 40 strokes over the upper left chest as  indicated in your monitor instructions.  Clean area with 4 enclosed alcohol pads. Let dry.  Apply patch as indicated in monitor instructions. Patch will be placed under collarbone on left  side of chest with arrow pointing upward.  Rub patch adhesive wings for 2 minutes. Remove white label marked "1". Remove the white  label marked "2". Rub patch adhesive wings for 2 additional minutes.  While looking in a mirror, press and release button in center of patch. A small green light will  flash 3-4 times. This will be your only indicator that the monitor has been turned on.  Do not shower for the first 24  hours. You may shower after the first 24 hours.  Press the button if you feel a symptom. You will hear a small click. Record Date, Time and  Symptom in the Patient Logbook.  When you are ready to remove the patch, follow instructions on the last 2 pages of Patient  Logbook. Stick patch monitor onto the last page of Patient Logbook.  Place Patient Logbook in the blue and white box. Use locking tab on box and tape box closed  securely. The blue and white box has prepaid postage on it. Please place it in the mailbox as  soon as possible. Your physician should have your test results approximately 7 days after the  monitor has been mailed back to [facility].  Call [facility] at [phone] if you have questions regarding  your ZIO XT patch monitor. Call them immediately if you see an orange light blinking on your  monitor.  If your monitor falls off in less than 4 days, contact our Monitor department at [phone].  If your monitor becomes loose or falls off after 4 days call [employer] at [phone] for  suggestions on securing your monitor    Follow-Up: At [facility], you and your health needs are our priority.  As part of our continuing mission to provide you with exceptional heart care, we have created designated Provider Care Teams.  These Care Teams include your primary Smith (physician) and Advanced Practice Providers (APPs -  Physician Assistants and Nurse Practitioners) who all work together to provide you with the care you need, when you need it.  We recommend signing up for the patient portal called "MyChart".  Sign up information is provided on this After Visit Summary.  MyChart is used to connect with patients for Virtual Visits (Telemedicine).  Patients are able to view lab/test results, encounter notes, upcoming appointments, etc.  Non-urgent messages can be sent to your provider as well.   To learn more about what you can do with MyChart,  go to [email or web address].    Your next appointment:   6 month(s)  The format for your next appointment:   In Person  Provider:   DR. Johney Frame   Important Information About Sugar         Signed, Freada Bergeron, MD  03/23/2022 12:37 PM    [place]

## 2022-03-31 DIAGNOSIS — R002 Palpitations: Secondary | ICD-10-CM | POA: Diagnosis not present

## 2022-04-07 ENCOUNTER — Telehealth (HOSPITAL_COMMUNITY): Payer: Self-pay

## 2022-04-07 NOTE — Telephone Encounter (Signed)
Detailed instructions left on the patient's answering machine. Asked to call back with questions. S.Almyra Birman EMTP

## 2022-04-14 ENCOUNTER — Ambulatory Visit (HOSPITAL_COMMUNITY): Payer: PPO | Attending: Cardiovascular Disease

## 2022-04-14 ENCOUNTER — Ambulatory Visit (HOSPITAL_BASED_OUTPATIENT_CLINIC_OR_DEPARTMENT_OTHER): Payer: PPO

## 2022-04-14 DIAGNOSIS — J439 Emphysema, unspecified: Secondary | ICD-10-CM | POA: Diagnosis not present

## 2022-04-14 DIAGNOSIS — E785 Hyperlipidemia, unspecified: Secondary | ICD-10-CM | POA: Insufficient documentation

## 2022-04-14 DIAGNOSIS — R002 Palpitations: Secondary | ICD-10-CM | POA: Insufficient documentation

## 2022-04-14 DIAGNOSIS — R0609 Other forms of dyspnea: Secondary | ICD-10-CM | POA: Insufficient documentation

## 2022-04-14 DIAGNOSIS — F17200 Nicotine dependence, unspecified, uncomplicated: Secondary | ICD-10-CM | POA: Diagnosis not present

## 2022-04-14 DIAGNOSIS — I25119 Atherosclerotic heart disease of native coronary artery with unspecified angina pectoris: Secondary | ICD-10-CM | POA: Diagnosis not present

## 2022-04-14 DIAGNOSIS — G4730 Sleep apnea, unspecified: Secondary | ICD-10-CM | POA: Insufficient documentation

## 2022-04-14 DIAGNOSIS — I10 Essential (primary) hypertension: Secondary | ICD-10-CM | POA: Insufficient documentation

## 2022-04-14 DIAGNOSIS — R079 Chest pain, unspecified: Secondary | ICD-10-CM | POA: Diagnosis not present

## 2022-04-14 LAB — ECHOCARDIOGRAM COMPLETE
Area-P 1/2: 3.59 cm2
Height: 70 in
S' Lateral: 1.8 cm
Weight: 2848 oz

## 2022-04-14 LAB — MYOCARDIAL PERFUSION IMAGING
Base ST Depression (mm): 0 mm
LV dias vol: 72 mL (ref 62–150)
LV sys vol: 21 mL
Nuc Stress EF: 72 %
Peak HR: 81 {beats}/min
Rest HR: 68 {beats}/min
Rest Nuclear Isotope Dose: 10.8 mCi
SDS: 3
SRS: 0
SSS: 3
ST Depression (mm): 0 mm
Stress Nuclear Isotope Dose: 31.8 mCi
TID: 0.97

## 2022-04-14 MED ORDER — PERFLUTREN LIPID MICROSPHERE
1.0000 mL | INTRAVENOUS | Status: AC | PRN
  Administered 2022-04-14: 2 mL via INTRAVENOUS

## 2022-04-14 MED ORDER — REGADENOSON 0.4 MG/5ML IV SOLN
0.4000 mg | Freq: Once | INTRAVENOUS | Status: AC
  Administered 2022-04-14: 0.4 mg via INTRAVENOUS

## 2022-04-14 MED ORDER — TECHNETIUM TC 99M TETROFOSMIN IV KIT
10.8000 | PACK | Freq: Once | INTRAVENOUS | Status: AC | PRN
Start: 2022-04-14 — End: 2022-04-14
  Administered 2022-04-14: 10.8 via INTRAVENOUS

## 2022-04-14 MED ORDER — TECHNETIUM TC 99M TETROFOSMIN IV KIT
31.8000 | PACK | Freq: Once | INTRAVENOUS | Status: AC | PRN
  Administered 2022-04-14: 31.8 via INTRAVENOUS

## 2022-04-29 ENCOUNTER — Encounter: Payer: Self-pay | Admitting: Pulmonary Disease

## 2022-04-29 ENCOUNTER — Ambulatory Visit (INDEPENDENT_AMBULATORY_CARE_PROVIDER_SITE_OTHER): Payer: PPO | Admitting: Pulmonary Disease

## 2022-04-29 VITALS — BP 138/68 | HR 77 | Temp 98.0°F | Ht 70.0 in | Wt 178.8 lb

## 2022-04-29 DIAGNOSIS — G4733 Obstructive sleep apnea (adult) (pediatric): Secondary | ICD-10-CM

## 2022-04-29 NOTE — Progress Notes (Signed)
Dennis Smith    [id number]    21-Jul-1950  Primary Care Physician:Williams, Gracelyn Nurse, PA-C  Referring Physician: Heywood Bene, PA-C [address],  [place] [address]  Chief complaint:   Patient with moderate obstructive sleep apnea on CPAP therapy in for follow-up  HPI:  Diagnosed with moderate obstructive sleep apnea Has been using CPAP nightly  Had some difficulty tolerating CPAP initially but has been doing well Currently on a CPAP of 18 and seems to be doing well with it  Recent mask changes has really helped  Sleeps about 7 hours, wakes up feeling like he is at a good nights rest Feels the pressure may still be a little bit too much for him  Usually goes to bed about 10 PM Falls asleep soon after Wakes up several times during the night Final wake up time between 6 and 6:30 AM Weight has been relatively stable  No family history of obstructive sleep apnea Denies significant dryness, occasionally does have dryness No headaches in the morning Occasional night sweats  Has only ever had gasping respirations months Memory is good Obstructive sleep apnea Family  History of high blood pressure, history of asthma  Active smoker, half pack a day  Outpatient Encounter Medications as of 04/29/2022  Medication Sig   amLODipine (NORVASC) 5 MG tablet Take 1 tablet (5 mg total) by mouth daily.   Ascorbic Acid (VITAMIN C PO) Take 1 tablet by mouth every other day.   aspirin EC 81 MG tablet Take 81 mg by mouth daily. Swallow whole.   atorvastatin (LIPITOR) 10 MG tablet Take 10 mg by mouth daily.   calcium-vitamin D (OSCAL WITH D) 500-200 MG-UNIT tablet Take 1 tablet by mouth.   cetirizine (ZYRTEC) 10 MG tablet Take 10 mg by mouth daily.   Cholecalciferol (VITAMIN D-1000 MAX ST) 25 MCG (1000 UT) tablet Take 1,000 Units by mouth every other day.   CINNAMON PO Take 1 capsule by mouth daily.   Cyanocobalamin (VITAMIN B-12 PO) Take 1 tablet  by mouth daily.   metoprolol succinate (TOPROL-XL) 50 MG 24 hr tablet Take 75 mg by mouth daily.   Omega-3 Fatty Acids (FISH OIL PO) Take 1 capsule by mouth daily with supper.   omeprazole (PRILOSEC) 20 MG capsule Take 20 mg by mouth daily before breakfast.   tamsulosin (FLOMAX) 0.4 MG CAPS capsule Take 0.4 mg by mouth daily.   TURMERIC PO Take 1 capsule by mouth daily.   aspirin EC 325 MG tablet Take 1 tablet (325 mg total) by mouth 2 (two) times daily.   [DISCONTINUED] lisinopril (PRINIVIL,ZESTRIL) 20 MG tablet Take 20 mg by mouth every morning.    No facility-administered encounter medications on file as of 04/29/2022.    Allergies as of 04/29/2022 - Review Complete 04/29/2022  Allergen Reaction Noted   Lisinopril Swelling 04/23/2020    Past Medical History:  Diagnosis Date   Hyperlipidemia    Hypertension     Past Surgical History:  Procedure Laterality Date   BACK SURGERY     JOINT REPLACEMENT     ORIF FEMUR FRACTURE Left 11/11/2016   Procedure: OPEN REDUCTION INTERNAL FIXATION (ORIF) LEFT FEMUR FRACTURE AND ARTHOTOMY;  Surgeon: Frederik Pear, MD;  Location: [place];  Service: Orthopedics;  Laterality: Left;    Family History  Problem Relation Age of Onset   Heart attack Father     Social History   Socioeconomic History  Marital status: Married    Spouse name: Not on file   Number of children: Not on file   Years of education: Not on file   Highest education level: Not on file  Occupational History   Not on file  Tobacco Use   Smoking status: Every Day    Packs/day: 1.00    Years: 40.00    Total pack years: 40.00    Types: Cigarettes   Smokeless tobacco: Never  Substance and Sexual Activity   Alcohol use: Yes    Alcohol/week: 8.0 standard drinks of alcohol    Types: 8 Glasses of wine per week   Drug use: No   Sexual activity: Yes  Other Topics Concern   Not on file  Social History Narrative   Not on file   Social Determinants of Health   Financial  Resource Strain: Not on file  Food Insecurity: Not on file  Transportation Needs: Not on file  Physical Activity: Not on file  Stress: Not on file  Social Connections: Not on file  Intimate Partner Violence: Not on file    Review of Systems  Respiratory:  Positive for apnea.   Psychiatric/Behavioral:  Positive for sleep disturbance.     Vitals:   04/29/22 1541  BP: 138/68  Pulse: 77  Temp: 98 F (36.7 C)  SpO2: 97%     Physical Exam Constitutional:      Appearance: Normal appearance.  HENT:     Head: Normocephalic.     Mouth/Throat:     Mouth: Mucous membranes are moist.     Comments: Mallampati two Eyes:     General:        Right eye: No discharge.        Left eye: No discharge.     Pupils: Pupils are equal, round, and reactive to light.  Cardiovascular:     Rate and Rhythm: Normal rate and regular rhythm.     Heart sounds: No murmur heard.    No friction rub.  Pulmonary:     Effort: No respiratory distress.     Breath sounds: No stridor. No wheezing.  Chest:     Chest wall: No tenderness.  Musculoskeletal:     Cervical back: No rigidity or tenderness.  Neurological:     Mental Status: He is alert.  Psychiatric:        Mood and Affect: Mood normal.       04/29/2022    3:00 PM 04/01/2020    9:00 AM  Results of the Epworth flowsheet  Sitting and reading 0 0  Watching TV 0 2  Sitting, inactive in a public place (e.g. a theatre or a meeting) 0 0  As a passenger in a car for an hour without a break 0 0  Lying down to rest in the afternoon when circumstances permit 3 2  Sitting and talking to someone 0 0  Sitting quietly after a lunch without alcohol 0 0  In a car, while stopped for a few minutes in traffic 0 0  Total score 3 4    Data Reviewed: Referral records reviewed  Sleep study results reviewed with the patient 100% compliance Average use of 7 hours 19 minutes On CPAP of 18 Residual AHI of 0.4  Assessment: Moderate obstructive sleep  apnea -On CPAP therapy -Tolerating CPAP well -Feels pressure may be too high for him  We did discuss adjusting pressures today  Nonrestorative sleep -Significant improvement in sleep quality  Nicotine dependence  Overall feels better  Plan/Recommendations: Adjust CPAP pressures  DME referral to decrease pressure from 18-16  Encourage patient to download app to monitor his CPAP to his phone  We will request for a download from his machine in about a month  Patient reassured about efficiency of CPAP as long as symptoms continue to feel better  Smoking cessation counseling  Follow-up 6 months  He will give [place] a call if he has any significant concerns   Virl Diamond MD [facility] 04/29/2022, 4:21 PM  CC: Dennis Smith, *

## 2022-04-29 NOTE — Patient Instructions (Signed)
Decrease pressure from 18-16  DME referral to decrease pressure  Download the app to your phone to keep an eye on your AHI  Get a download from the medical supply company in about a month  I will see you back in 6 months  Call with significant concerns

## 2022-08-20 ENCOUNTER — Encounter (HOSPITAL_BASED_OUTPATIENT_CLINIC_OR_DEPARTMENT_OTHER): Payer: Self-pay | Admitting: Urology

## 2022-08-20 ENCOUNTER — Other Ambulatory Visit: Payer: Self-pay

## 2022-08-20 ENCOUNTER — Emergency Department (HOSPITAL_BASED_OUTPATIENT_CLINIC_OR_DEPARTMENT_OTHER)
Admission: EM | Admit: 2022-08-20 | Discharge: 2022-08-20 | Disposition: A | Discharge status: Home / Self Care | Payer: PPO | Attending: Emergency Medicine | Admitting: Emergency Medicine

## 2022-08-20 DIAGNOSIS — E119 Type 2 diabetes mellitus without complications: Secondary | ICD-10-CM | POA: Insufficient documentation

## 2022-08-20 DIAGNOSIS — I10 Essential (primary) hypertension: Secondary | ICD-10-CM | POA: Insufficient documentation

## 2022-08-20 DIAGNOSIS — R531 Weakness: Secondary | ICD-10-CM | POA: Diagnosis not present

## 2022-08-20 DIAGNOSIS — Z79899 Other long term (current) drug therapy: Secondary | ICD-10-CM | POA: Insufficient documentation

## 2022-08-20 DIAGNOSIS — M2540 Effusion, unspecified joint: Secondary | ICD-10-CM | POA: Diagnosis not present

## 2022-08-20 DIAGNOSIS — M25512 Pain in left shoulder: Secondary | ICD-10-CM | POA: Diagnosis not present

## 2022-08-20 DIAGNOSIS — Z7982 Long term (current) use of aspirin: Secondary | ICD-10-CM | POA: Diagnosis not present

## 2022-08-20 DIAGNOSIS — M25511 Pain in right shoulder: Secondary | ICD-10-CM | POA: Diagnosis not present

## 2022-08-20 LAB — CBC WITH DIFFERENTIAL/PLATELET
Abs Immature Granulocytes: 0.04 10*3/uL (ref 0.00–0.07)
Basophils Absolute: 0.1 10*3/uL (ref 0.0–0.1)
Basophils Relative: 1 %
Eosinophils Absolute: 0.4 10*3/uL (ref 0.0–0.5)
Eosinophils Relative: 3 %
HCT: 39.5 % (ref 39.0–52.0)
Hemoglobin: 12.9 g/dL — ABNORMAL LOW (ref 13.0–17.0)
Immature Granulocytes: 0 %
Lymphocytes Relative: 25 %
Lymphs Abs: 3.1 10*3/uL (ref 0.7–4.0)
MCH: 29.5 pg (ref 26.0–34.0)
MCHC: 32.7 g/dL (ref 30.0–36.0)
MCV: 90.2 fL (ref 80.0–100.0)
Monocytes Absolute: 1.1 10*3/uL — ABNORMAL HIGH (ref 0.1–1.0)
Monocytes Relative: 8 %
Neutro Abs: 8 10*3/uL — ABNORMAL HIGH (ref 1.7–7.7)
Neutrophils Relative %: 63 %
Platelets: 338 10*3/uL (ref 150–400)
RBC: 4.38 MIL/uL (ref 4.22–5.81)
RDW: 13.2 % (ref 11.5–15.5)
WBC: 12.7 10*3/uL — ABNORMAL HIGH (ref 4.0–10.5)
nRBC: 0 % (ref 0.0–0.2)

## 2022-08-20 LAB — URINALYSIS, ROUTINE W REFLEX MICROSCOPIC
Bilirubin Urine: NEGATIVE
Glucose, UA: NEGATIVE mg/dL
Hgb urine dipstick: NEGATIVE
Ketones, ur: NEGATIVE mg/dL
Leukocytes,Ua: NEGATIVE
Nitrite: NEGATIVE
Protein, ur: NEGATIVE mg/dL
Specific Gravity, Urine: 1.015 (ref 1.005–1.030)
pH: 6.5 (ref 5.0–8.0)

## 2022-08-20 LAB — BASIC METABOLIC PANEL
Anion gap: 11 (ref 5–15)
BUN: 24 mg/dL — ABNORMAL HIGH (ref 8–23)
CO2: 26 mmol/L (ref 22–32)
Calcium: 9.2 mg/dL (ref 8.9–10.3)
Chloride: 98 mmol/L (ref 98–111)
Creatinine, Ser: 1.12 mg/dL (ref 0.61–1.24)
GFR, Estimated: >60 mL/min (ref >60)
Glucose, Bld: 123 mg/dL — ABNORMAL HIGH (ref 70–99)
Potassium: 3.8 mmol/L (ref 3.5–5.1)
Sodium: 135 mmol/L (ref 135–145)

## 2022-08-20 LAB — CK: Total CK: 59 U/L (ref 49–397)

## 2022-08-20 NOTE — ED Triage Notes (Addendum)
Pt states approx 1 month ago started having bilateral shoulder and arm soreness and weakness  Had xrays done with ortho and was told he had inflammation and got shots of cortisone  Was seen at pcp on 4/11 and had blood work done [employer] having trouble getting out of bed and feels like he is loosing strength and is having generalized muscle aches   NAD at this time

## 2022-08-20 NOTE — Discharge Instructions (Signed)
I would like for you to follow-up with your primary care doctor for further testing for possible rheumatological causes.  You can continue taking Tylenol for pain control.  Please return to the emergency room if any worsening symptoms you might have.

## 2022-08-20 NOTE — ED Provider Notes (Signed)
[place] [facility] Provider Note   CSN: [id number] Arrival date & time: 08/20/22  1419     History Chief Complaint  Patient presents with   Muscle Pain    Dennis Smith is a 72 y.o. male patient with history of diabetes started on metformin, hypertension, and hyperlipidemia who presents to the emerged part with a 1 month history of progressively worsening shoulder aching and pain with associated weakness.  He was initially seen eval by Ortho who thought that this was muscular in nature after doing some imaging and he was given steroid injections bilaterally.  This did not really help him at that time.  He did follow-up with his PCP and had some lab work done.  He had leukocytosis at that time approximately up to 18 they thought it was secondary to the cortisone injections that he received prior.  His hemoglobin A1c was also above 8% and he was started on metformin.   He states that he is now having a hard time getting up out of bed as his arms feel weak primarily at the shoulders with associated achiness.  Also noticing swelling in his hands that migrates.  He states that his symptoms are worse in the morning and better as he moves around throughout the day.  Better with Tylenol.   Muscle Pain       Home Medications Prior to Admission medications   Medication Sig Start Date End Date Taking? Authorizing Provider  amLODipine (NORVASC) 5 MG tablet Take 1 tablet (5 mg total) by mouth daily. 04/16/20   LampteyBritta Mccreedy, MD  Ascorbic Acid (VITAMIN C PO) Take 1 tablet by mouth every other day.    [provider]  aspirin EC 81 MG tablet Take 81 mg by mouth daily. Swallow whole.    [provider]  atorvastatin (LIPITOR) 10 MG tablet Take 10 mg by mouth daily.    [provider]  calcium-vitamin D (OSCAL WITH D) 500-200 MG-UNIT tablet Take 1 tablet by mouth.    [provider]  cetirizine (ZYRTEC) 10 MG tablet Take  10 mg by mouth daily.    [provider]  Cholecalciferol (VITAMIN D-1000 MAX ST) 25 MCG (1000 UT) tablet Take 1,000 Units by mouth every other day.    [provider]  CINNAMON PO Take 1 capsule by mouth daily.    [provider]  Cyanocobalamin (VITAMIN B-12 PO) Take 1 tablet by mouth daily.    [provider]  metoprolol succinate (TOPROL-XL) 50 MG 24 hr tablet Take 75 mg by mouth daily. 12/16/20   [provider]  Omega-3 Fatty Acids (FISH OIL PO) Take 1 capsule by mouth daily with supper.    [provider]  omeprazole (PRILOSEC) 20 MG capsule Take 20 mg by mouth daily before breakfast. 10/17/15   [provider]  tamsulosin (FLOMAX) 0.4 MG CAPS capsule Take 0.4 mg by mouth daily. 04/14/21   [provider]  TURMERIC PO Take 1 capsule by mouth daily.    [provider]  lisinopril (PRINIVIL,ZESTRIL) 20 MG tablet Take 20 mg by mouth every morning.  09/15/16 04/16/20  [provider]      Allergies    Lisinopril    Review of Systems   Review of Systems  All other systems reviewed and are negative.   Physical Exam Updated Vital Signs BP (!) 164/85 (BP Location: Left Arm)   Pulse 83   Temp 97.8 F (  36.6 C) (Oral)   Resp 18   SpO2 98%  Physical Exam Vitals and nursing note reviewed.  Constitutional:      General: He is not in acute distress.    Appearance: Normal appearance.  HENT:     Head: Normocephalic and atraumatic.  Eyes:     General:        Right eye: No discharge.        Left eye: No discharge.  Cardiovascular:     Comments: Regular rate and rhythm.  S1/S2 are distinct without any evidence of murmur, rubs, or gallops.  Radial pulses are 2+ bilaterally.  Dorsalis pedis pulses are 2+ bilaterally.  No evidence of pedal edema. Pulmonary:     Comments: Clear to auscultation bilaterally.  Normal effort.  No respiratory distress.  No evidence of wheezes, rales, or rhonchi heard  throughout. Abdominal:     General: Abdomen is flat. Bowel sounds are normal. There is no distension.     Tenderness: There is no abdominal tenderness. There is no guarding or rebound.  Musculoskeletal:        General: Normal range of motion.     Cervical back: Neck supple.     Comments: Swelling over the MCPs in the left hand.  No cogwheel rigidity with range of motion of the wrist bilaterally.  Patient does have good strength in the shoulders with abduction.  Good sensation.  Skin:    General: Skin is warm and dry.     Findings: No rash.  Neurological:     General: No focal deficit present.     Mental Status: He is alert.  Psychiatric:        Mood and Affect: Mood normal.        Behavior: Behavior normal.     ED Results / Procedures / Treatments   Labs (all labs ordered are listed, but only abnormal results are displayed) Labs Reviewed  CBC WITH DIFFERENTIAL/PLATELET - Abnormal; Notable for the following components:      Result Value   WBC 12.7 (*)    Hemoglobin 12.9 (*)    Neutro Abs 8.0 (*)    Monocytes Absolute 1.1 (*)    All other components within normal limits  BASIC METABOLIC PANEL - Abnormal; Notable for the following components:   Glucose, Bld 123 (*)    BUN 24 (*)    All other components within normal limits  CK  URINALYSIS, ROUTINE W REFLEX MICROSCOPIC    EKG None  Radiology No results found.  Procedures Procedures    Medications Ordered in ED Medications - No data to display  ED Course/ Medical Decision Making/ A&P Clinical Course as of 08/20/22 1814  Thu Aug 20, 2022  1755 CBC with Differential(!) There is evidence of leukocytosis. This seems to be down trending based on previous result. [CF]  1757 Basic metabolic panel(!) Slightly elevated glucose. [CF]  1757 CK Normal. [CF]  1757 Urinalysis, Routine w reflex microscopic -Urine, Clean Catch Normal. [CF]    Clinical Course User Index [CF] Teressa Lower, PA-C   {   Click here for  ABCD2, HEART and other calculators  Medical Decision Making DATON SZILAGYI is a 72 y.o. male patient who presents to the emergency department today for further evaluation of progressive muscle weakness in the shoulders and achiness.  I am concerned for rheumatological issues such as polymyositis or rheumatoid arthritis.  Obviously we are limited on the number of labs we can get here but I  will plan to get some basic labs to trend the leukocytosis in addition to getting a CK level and a urine.  Patient is in no acute distress at this time.  Patient's workup is normal today apart from some evidence of leukocytosis which seems to be downtrending in comparison to previous labs.  I am concerned for rheumatological condition in this patient does need a full rheumatological workup.  I will have him follow-up with his primary care doctor for further evaluation.  I will hold off on steroids so they can get accurate lab results.  He will continue taking Tylenol as needed for pain. Strict return precautions given. He is safe for discharge.   Amount and/or Complexity of Data Reviewed Labs: ordered.    Final Clinical Impression(s) / ED Diagnoses Final diagnoses:  Pain of both shoulder joints  Joint swelling    Rx / DC Orders ED Discharge Orders     None         Teressa Lower, [place] 08/20/22 1814    Maia Plan, MD 08/21/22 [phone]

## 2022-09-21 ENCOUNTER — Telehealth: Payer: Self-pay | Admitting: Pulmonary Disease

## 2022-09-21 DIAGNOSIS — G4733 Obstructive sleep apnea (adult) (pediatric): Secondary | ICD-10-CM

## 2022-09-21 NOTE — Telephone Encounter (Signed)
Okay to place order to renew cpap supplies?

## 2022-09-21 NOTE — Telephone Encounter (Signed)
Yes.  Okay to place order for CPAP supplies

## 2022-09-21 NOTE — Telephone Encounter (Signed)
Pt. Stated he has been without supplies for Cpap machine in some time if we acan advise for new order to be place to [employer]

## 2022-09-22 NOTE — Telephone Encounter (Signed)
Order has been placed, patient has been contacted.

## 2022-09-23 ENCOUNTER — Ambulatory Visit: Payer: PPO | Admitting: Pulmonary Disease

## 2022-09-23 ENCOUNTER — Encounter: Payer: Self-pay | Admitting: Pulmonary Disease

## 2022-09-23 VITALS — BP 136/70 | HR 69 | Ht 70.0 in | Wt 174.0 lb

## 2022-09-23 DIAGNOSIS — G4733 Obstructive sleep apnea (adult) (pediatric): Secondary | ICD-10-CM | POA: Diagnosis not present

## 2022-09-23 NOTE — Progress Notes (Unsigned)
Cardiology Office Note:    Date:  09/24/2022   ID:  Dennis Smith, DOB Sep 20, 1950, MRN [id number]  PCP:  Bernita Buffy   [facility] Providers Cardiologist:  None   Referring MD: Roger Kill, *    History of Present Illness:    Dennis Smith is a 72 y.o. male with a hx of HTN, history of tobacco abuse, OSA on CPAP, coronary artery Ca on CT chest, and HLD who presents to clinic for follow-up.  Patient initially seen in 03/2022 for palpitations and "irregular rhythm" noted on his apple watch in the setting of a suspected allergic reaction. Was also having DOE. TTE 04/2022 with EF 60-65%, G2DD, normal RV, no significant valve disease. SPECT 04/2022 normal. Zio monitor with rare ectopy, 1 run NSVT lasting 7 beats, 7 runs of SVT with longest lasting 14 beats. He was continued on metop.  Today, the patient states he is doing okay. Has recently seen a rheumatologist for diffuse joint pains and myalgias that developed suddenly with concern for RA vs PMR. Has been started on prednisone and his symptoms have significantly improved. Otherwise, no chest pain, SOB, orthopnea, LE edema, or PND. Compliant with CPAP. No significant palpitations or irregular heart beats.   Past Medical History:  Diagnosis Date   Hyperlipidemia    Hypertension     Past Surgical History:  Procedure Laterality Date   BACK SURGERY     JOINT REPLACEMENT     ORIF FEMUR FRACTURE Left 11/11/2016   Procedure: OPEN REDUCTION INTERNAL FIXATION (ORIF) LEFT FEMUR FRACTURE AND ARTHOTOMY;  Surgeon: Gean Birchwood, MD;  Location: [facility] OR;  Service: Orthopedics;  Laterality: Left;    Current Medications: Current Meds  Medication Sig   amLODipine (NORVASC) 10 MG tablet Take 1 tablet (10 mg total) by mouth daily.   Ascorbic Acid (VITAMIN C PO) Take 1 tablet by mouth every other day.   aspirin EC 81 MG tablet Take 81 mg by mouth daily. Swallow whole.   calcium-vitamin D (OSCAL WITH D) 500-200  MG-UNIT tablet Take 1 tablet by mouth.   cetirizine (ZYRTEC) 10 MG tablet Take 10 mg by mouth daily.   Cholecalciferol (VITAMIN D-1000 MAX ST) 25 MCG (1000 UT) tablet Take 1,000 Units by mouth every other day.   CINNAMON PO Take 1 capsule by mouth daily.   Cyanocobalamin (VITAMIN B-12 PO) Take 1 tablet by mouth daily.   metFORMIN (GLUCOPHAGE-XR) 500 MG 24 hr tablet Take 500 mg by mouth every morning.   metoprolol succinate (TOPROL-XL) 50 MG 24 hr tablet Take 75 mg by mouth daily.   Omega-3 Fatty Acids (FISH OIL PO) Take 1 capsule by mouth daily with supper.   omeprazole (PRILOSEC) 20 MG capsule Take 20 mg by mouth daily before breakfast.   predniSONE (DELTASONE) 10 MG tablet Take 10 mg by mouth 2 (two) times daily.   tamsulosin (FLOMAX) 0.4 MG CAPS capsule Take 0.4 mg by mouth daily.   TURMERIC PO Take 1 capsule by mouth daily.   [DISCONTINUED] amLODipine (NORVASC) 5 MG tablet Take 1 tablet (5 mg total) by mouth daily.     Allergies:   Lisinopril   Social History   Socioeconomic History   Marital status: Married    Spouse name: Not on file   Number of children: Not on file   Years of education: Not on file   Highest education level: Not on file  Occupational History   Not on file  Tobacco Use  Smoking status: Every Day    Packs/day: 1.00    Years: 40.00    Additional pack years: 0.00    Total pack years: 40.00    Types: Cigarettes   Smokeless tobacco: Never  Substance and Sexual Activity   Alcohol use: Yes    Alcohol/week: 8.0 standard drinks of alcohol    Types: 8 Glasses of wine per week   Drug use: No   Sexual activity: Yes  Other Topics Concern   Not on file  Social History Narrative   Not on file   Social Determinants of Health   Financial Resource Strain: Not on file  Food Insecurity: Not on file  Transportation Needs: Not on file  Physical Activity: Not on file  Stress: Not on file  Social Connections: Not on file     Family History: The patient's  family history includes Heart attack in his father.  ROS:   As per HPI   EKGs/Labs/Other Studies Reviewed:    The following studies were reviewed today:  Cardiac Studies & Procedures     STRESS TESTS  MYOCARDIAL PERFUSION IMAGING 04/14/2022  Narrative   The study is normal. The study is low risk.   No ST deviation was noted.   LV perfusion is normal. There is no evidence of ischemia. There is no evidence of infarction.   Left ventricular function is normal. Nuclear stress EF: 72 %. The left ventricular ejection fraction is hyperdynamic (>65%). End diastolic cavity size is normal. End systolic cavity size is normal.   ECHOCARDIOGRAM  ECHOCARDIOGRAM COMPLETE 04/14/2022  Narrative ECHOCARDIOGRAM REPORT    Patient Name:   Dennis Smith Date of Exam: 04/14/2022 Medical Rec #:  [id number]      Height:       70.0 in Accession #:    [id number]     Weight:       178.0 lb Date of Birth:  [birth year]      BSA:          1.986 m Patient Age:    71 years       BP:           132/74 mmHg Patient Gender: M              HR:           74 bpm. Exam Location:  [address]  Procedure: 2D Echo, Cardiac Doppler, Color Doppler and Intracardiac Opacification Agent  Indications:    R06.09 Dyspnea  History:        Patient has no prior history of Echocardiogram examinations. CAD, Signs/Symptoms:Dyspnea; Risk Factors:Family History of Coronary Artery Disease, Hypertension, Dyslipidemia, Current Smoker and Sleep Apnea. Palpitations.  Sonographer:    Farrel Conners RDCS Referring Phys: Kathlynn Grate Navia Lindahl  IMPRESSIONS   1. Left ventricular ejection fraction, by estimation, is 60 to 65%. The left ventricle has normal function. The left ventricle has no regional wall motion abnormalities. Left ventricular diastolic parameters are consistent with Grade II diastolic dysfunction (pseudonormalization). 2. Right ventricular systolic function is normal. The right ventricular size is normal. There  is mildly elevated pulmonary artery systolic pressure. 3. Left atrial size was moderately dilated. 4. Right atrial size was moderately dilated. 5. The mitral valve is normal in structure. Trivial mitral valve regurgitation. No evidence of mitral stenosis. 6. The aortic valve is normal in structure. Aortic valve regurgitation is trivial. No aortic stenosis is present. 7. The inferior vena cava is normal in size with greater than 50% respiratory  variability, suggesting right atrial pressure of 3 mmHg.  FINDINGS Left Ventricle: Left ventricular ejection fraction, by estimation, is 60 to 65%. The left ventricle has normal function. The left ventricle has no regional wall motion abnormalities. Definity contrast agent was given IV to delineate the left ventricular endocardial borders. The left ventricular internal cavity size was normal in size. There is no left ventricular hypertrophy. Left ventricular diastolic parameters are consistent with Grade II diastolic dysfunction (pseudonormalization).  Right Ventricle: The right ventricular size is normal. No increase in right ventricular wall thickness. Right ventricular systolic function is normal. There is mildly elevated pulmonary artery systolic pressure. The tricuspid regurgitant velocity is 2.92 m/s, and with an assumed right atrial pressure of 3 mmHg, the estimated right ventricular systolic pressure is 37.1 mmHg.  Left Atrium: Left atrial size was moderately dilated.  Right Atrium: Right atrial size was moderately dilated.  Pericardium: There is no evidence of pericardial effusion.  Mitral Valve: The mitral valve is normal in structure. Trivial mitral valve regurgitation. No evidence of mitral valve stenosis.  Tricuspid Valve: The tricuspid valve is normal in structure. Tricuspid valve regurgitation is trivial. No evidence of tricuspid stenosis.  Aortic Valve: The aortic valve is normal in structure. Aortic valve regurgitation is trivial. No  aortic stenosis is present.  Pulmonic Valve: The pulmonic valve was normal in structure. Pulmonic valve regurgitation is not visualized. No evidence of pulmonic stenosis.  Aorta: The aortic root is normal in size and structure.  Venous: The inferior vena cava is normal in size with greater than 50% respiratory variability, suggesting right atrial pressure of 3 mmHg.  IAS/Shunts: No atrial level shunt detected by color flow Doppler.   LEFT VENTRICLE PLAX 2D LVIDd:         4.20 cm   Diastology LVIDs:         1.80 cm   LV e' medial:    9.79 cm/s LV PW:         0.90 cm   LV E/e' medial:  10.2 LV IVS:        1.10 cm   LV e' lateral:   10.80 cm/s LVOT diam:     2.00 cm   LV E/e' lateral: 9.2 LV SV:         85 LV SV Index:   43 LVOT Area:     3.14 cm   RIGHT VENTRICLE RV Basal diam:  4.10 cm RV Mid diam:    3.20 cm RV S prime:     17.30 cm/s TAPSE (M-mode): 2.5 cm  LEFT ATRIUM           Index        RIGHT ATRIUM           Index LA diam:      4.50 cm 2.27 cm/m   RA Area:     21.20 cm LA Vol (A4C): 72.6 ml 36.55 ml/m  RA Volume:   64.70 ml  32.57 ml/m AORTIC VALVE LVOT Vmax:   140.50 cm/s LVOT Vmean:  88.750 cm/s LVOT VTI:    0.272 m  AORTA Ao Root diam: 3.50 cm Ao Asc diam:  2.50 cm  MITRAL VALVE               TRICUSPID VALVE MV Area (PHT): cm         TR Peak grad:   34.1 mmHg MV Decel Time: 212 msec    TR Vmax:        292.00 cm/s  MV E velocity: 99.70 cm/s MV A velocity: 95.20 cm/s  SHUNTS MV E/A ratio:  1.05        Systemic VTI:  0.27 m Systemic Diam: 2.00 cm  Chilton Si MD Electronically signed by Chilton Si MD Signature Date/Time: 04/14/2022/1:40:51 PM    Final    MONITORS  LONG TERM MONITOR (3-14 DAYS) 04/14/2022  Narrative   Patch wear time was 7 days   Predominant rhythm was NSR with average HR 79bpm   There was 1 run of NSVT lasting 7 beats   There were 7 runs of SVT with longest lasting 14 beats   Rare SVE, rare VE (<1%)   No  sustained arrhythmias or pauses   Patient triggered event correlated with NSR   Patch Wear Time:  7 days and 0 hours (2023-11-28T10:07:20-0500 to 2023-12-05T10:17:41-0500)  Patient had a min HR of 58 bpm, max HR of 190 bpm, and avg HR of 79 bpm. Predominant underlying rhythm was Sinus Rhythm. 1 run of Ventricular Tachycardia occurred lasting 7 beats with a max rate of 139 bpm (avg 123 bpm). 7 Supraventricular Tachycardia runs occurred, the run with the fastest interval lasting 14 beats with a max rate of 190 bpm (avg 166 bpm); the run with the fastest interval was also the longest. Isolated SVEs were rare (<1.0%), SVE Couplets were rare (<1.0%), and SVE Triplets were rare (<1.0%). Isolated VEs were rare (<1.0%), and no VE Couplets or VE Triplets were present.  Laurance Flatten, MD            EKG:  No new tracing  Recent Labs: 08/20/2022: BUN 24; Creatinine, Ser 1.12; Hemoglobin 12.9; Platelets 338; Potassium 3.8; Sodium 135   Recent Lipid Panel No results found for: "CHOL", "TRIG", "HDL", "CHOLHDL", "VLDL", "LDLCALC", "LDLDIRECT"   Risk Assessment/Calculations:      HYPERTENSION CONTROL Vitals:   09/24/22 0917  BP: (!) 142/62    The patient's blood pressure is elevated above target today.  In order to address the patient's elevated BP: A current anti-hypertensive medication was adjusted today.            Physical Exam:    VS:  BP (!) 142/62   Pulse 70   Ht 5\' 10"  (1.778 m)   Wt 174 lb 12.8 oz (79.3 kg)   SpO2 98%   BMI 25.08 kg/m     Wt Readings from Last 3 Encounters:  09/24/22 174 lb 12.8 oz (79.3 kg)  09/23/22 174 lb (78.9 kg)  04/29/22 178 lb 12.8 oz (81.1 kg)     GEN:  Well nourished, well developed in no acute distress HEENT: Normal NECK: No JVD; No carotid bruits CARDIAC: RRR, no murmurs RESPIRATORY:   Diminished but clear  ABDOMEN: Soft, non-tender, non-distended MUSCULOSKELETAL:  No edema; No deformity  SKIN: Warm and dry NEUROLOGIC:  Alert and  oriented x 3 PSYCHIATRIC:  Normal affect   ASSESSMENT:    1. Palpitations   2. Coronary artery disease involving native coronary artery of native heart with angina pectoris (HCC)   3. Tobacco abuse   4. Primary hypertension   5. Mixed hyperlipidemia    PLAN:    In order of problems listed above:  #Palpitations: Patient with episode of irregular heart rhythm following what sounds to be an allergic reaction. Symptoms lasted several hours before resolving. Zio monitor reassuring with no significant arrhythmias or ectopy. Overall patient is doing well without significant symptoms. -Continue metop 75mg  XL daily  #CAD with Ca on CT chest:  Myoview without ischemia. TTE with normal BiV function. Will continue with medical management. -Continue lipitor 10mg  dialy  #HTN: -Elevated to 140s (in the setting of steroid use) -Continue metoprolol 75mg  XL daily -Increase amlodipine to 10mg  daily -Reportedly on triameteren-HCTZ -No ACE/ARB due to angioedema  #HLD: -Continue lipitor 10mg  daily -Goal LDL<70; currently 77  (will work on lifestyle and adjust as needed)  #Tobacco Abuse: Continues to smoke 3/4 ppd. Not interested in quitting at this time. Encouraged cessation. -Encouraged cessation -Annual CT chest for lung cancer screening  #Concern for PMR vs RA: -Follows with Rheum -Currently on prednisone     Medication Adjustments/Labs and Tests Ordered: Current medicines are reviewed at length with the patient today.  Concerns regarding medicines are outlined above.  No orders of the defined types were placed in this encounter.  Meds ordered this encounter  Medications   amLODipine (NORVASC) 10 MG tablet    Sig: Take 1 tablet (10 mg total) by mouth daily.    Dispense:  90 tablet    Refill:  3    Dose increase   Patient Instructions  Medication Instructions:   INCREASE YOUR AMLODIPINE TO 10 MG BY MOUTH DAILY  *If you need a refill on your cardiac medications before your next  appointment, please call your pharmacy*    Follow-Up: At [facility], you and your health needs are our priority.  As part of our continuing mission to provide you with exceptional heart care, we have created designated Provider Care Teams.  These Care Teams include your primary Cardiologist (physician) and Advanced Practice Providers (APPs -  Physician Assistants and Nurse Practitioners) who all work together to provide you with the care you need, when you need it.  We recommend signing up for the patient portal called "MyChart".  Sign up information is provided on this After Visit Summary.  MyChart is used to connect with patients for Virtual Visits (Telemedicine).  Patients are able to view lab/test results, encounter notes, upcoming appointments, etc.  Non-urgent messages can be sent to your provider as well.   To learn more about what you can do with MyChart, go to [email or web address].    Your next appointment:   6 month(s)  Provider:   DR. MARK SKAINS      Signed, Meriam Sprague, MD  09/24/2022 10:18 AM    [facility]

## 2022-09-23 NOTE — Patient Instructions (Signed)
DME referral for CPAP supplies-[facility]  Contact DME for CPAP pressure change -Decrease pressure from 18 down to 16  Follow-up in 6 months  Call with significant concerns

## 2022-09-23 NOTE — Progress Notes (Signed)
Dennis Smith    [id number]    03-Jan-1951  Primary Care Physician:Williams, Karis Juba, PA-C  Referring Physician: Roger Kill, PA-C [id number]  [place] [address]  Chief complaint:   Patient with moderate obstructive sleep apnea on CPAP therapy in for follow-up  HPI:  Patient with moderate obstructive sleep apnea Has been using CPAP nightly  Not having any difficulty -The last time patient was in the office, we had decided to decrease his pressures from 18-16 but this has not been facilitated yet  Not having significant problems with tolerating the CPAP  Has been having some joint pain and joint swelling recently undergoing rheumatologic workup with working diagnosis of possibly rheumatoid arthritis-continues to follow-up with rheumatology on a regular basis  Has no significant concerns with his CPAP today  Sleeps about 7 hours, wakes up feeling like he is at a good nights rest Feels the pressure may still be a little bit too much for him  Usually goes to bed about 10 PM Falls asleep soon after Wakes up several times during the night Final wake up time between 6 and 6:30 AM Weight has been relatively stable  No family history of obstructive sleep apnea Denies significant dryness, occasionally does have dryness No headaches in the morning Occasional night sweats  Has only ever had gasping respirations months Memory is good Obstructive sleep apnea Family  History of high blood pressure, history of asthma  Active smoker, half pack a day  Outpatient Encounter Medications as of 09/23/2022  Medication Sig   amLODipine (NORVASC) 5 MG tablet Take 1 tablet (5 mg total) by mouth daily.   Ascorbic Acid (VITAMIN C PO) Take 1 tablet by mouth every other day.   aspirin EC 81 MG tablet Take 81 mg by mouth daily. Swallow whole.   calcium-vitamin D (OSCAL WITH D) 500-200 MG-UNIT tablet Take 1 tablet by mouth.   cetirizine (ZYRTEC) 10 MG  tablet Take 10 mg by mouth daily.   Cholecalciferol (VITAMIN D-1000 MAX ST) 25 MCG (1000 UT) tablet Take 1,000 Units by mouth every other day.   CINNAMON PO Take 1 capsule by mouth daily.   Cyanocobalamin (VITAMIN B-12 PO) Take 1 tablet by mouth daily.   metFORMIN (GLUCOPHAGE-XR) 500 MG 24 hr tablet Take 500 mg by mouth every morning.   metoprolol succinate (TOPROL-XL) 50 MG 24 hr tablet Take 75 mg by mouth daily.   Omega-3 Fatty Acids (FISH OIL PO) Take 1 capsule by mouth daily with supper.   omeprazole (PRILOSEC) 20 MG capsule Take 20 mg by mouth daily before breakfast.   predniSONE (DELTASONE) 10 MG tablet Take 10 mg by mouth 2 (two) times daily.   tamsulosin (FLOMAX) 0.4 MG CAPS capsule Take 0.4 mg by mouth daily.   TURMERIC PO Take 1 capsule by mouth daily.   atorvastatin (LIPITOR) 10 MG tablet Take 10 mg by mouth daily. (Patient not taking: Reported on 09/23/2022)   [DISCONTINUED] lisinopril (PRINIVIL,ZESTRIL) 20 MG tablet Take 20 mg by mouth every morning.    No facility-administered encounter medications on file as of 09/23/2022.    Allergies as of 09/23/2022 - Review Complete 09/23/2022  Allergen Reaction Noted   Lisinopril Swelling 04/23/2020    Past Medical History:  Diagnosis Date   Hyperlipidemia    Hypertension     Past Surgical History:  Procedure Laterality Date   BACK SURGERY     JOINT REPLACEMENT  ORIF FEMUR FRACTURE Left 11/11/2016   Procedure: OPEN REDUCTION INTERNAL FIXATION (ORIF) LEFT FEMUR FRACTURE AND ARTHOTOMY;  Surgeon: Gean Birchwood, MD;  Location: [facility] OR;  Service: Orthopedics;  Laterality: Left;    Family History  Problem Relation Age of Onset   Heart attack Father     Social History   Socioeconomic History   Marital status: Married    Spouse name: Not on file   Number of children: Not on file   Years of education: Not on file   Highest education level: Not on file  Occupational History   Not on file  Tobacco Use   Smoking status: Every  Day    Packs/day: 1.00    Years: 40.00    Additional pack years: 0.00    Total pack years: 40.00    Types: Cigarettes   Smokeless tobacco: Never  Substance and Sexual Activity   Alcohol use: Yes    Alcohol/week: 8.0 standard drinks of alcohol    Types: 8 Glasses of wine per week   Drug use: No   Sexual activity: Yes  Other Topics Concern   Not on file  Social History Narrative   Not on file   Social Determinants of Health   Financial Resource Strain: Not on file  Food Insecurity: Not on file  Transportation Needs: Not on file  Physical Activity: Not on file  Stress: Not on file  Social Connections: Not on file  Intimate Partner Violence: Not on file    Review of Systems  Respiratory:  Positive for apnea.   Psychiatric/Behavioral:  Positive for sleep disturbance.     Vitals:   09/23/22 0852  BP: 136/70  Pulse: 69  SpO2: 97%     Physical Exam Constitutional:      Appearance: Normal appearance.  HENT:     Head: Normocephalic.     Mouth/Throat:     Mouth: Mucous membranes are moist.     Comments: Mallampati two Eyes:     General:        Right eye: No discharge.        Left eye: No discharge.     Pupils: Pupils are equal, round, and reactive to light.  Cardiovascular:     Rate and Rhythm: Normal rate and regular rhythm.     Heart sounds: No murmur heard.    No friction rub.  Pulmonary:     Effort: No respiratory distress.     Breath sounds: No stridor. No wheezing.  Chest:     Chest wall: No tenderness.  Musculoskeletal:     Cervical back: No rigidity or tenderness.  Neurological:     Mental Status: He is alert.  Psychiatric:        Mood and Affect: Mood normal.      04/29/2022    3:00 PM 04/01/2020    9:00 AM  Results of the Epworth flowsheet  Sitting and reading 0 0  Watching TV 0 2  Sitting, inactive in a public place (e.g. a theatre or a meeting) 0 0  As a passenger in a car for an hour without a break 0 0  Lying down to rest in the  afternoon when circumstances permit 3 2  Sitting and talking to someone 0 0  Sitting quietly after a lunch without alcohol 0 0  In a car, while stopped for a few minutes in traffic 0 0  Total score 3 4    Data Reviewed: Referral records reviewed  Compliance data  reviewed showing 100% compliance Average use of 7 hours 19 minutes On CPAP of 18 Residual AHI of 0.8 Assessment: Moderate obstructive sleep apnea -Continues on CPAP therapy -Feels pressure may still be too high for him -Will make contact with his DME company at [facility] for pressure changes  Nicotine dependence  Overall feels fairly well  Continues to follow-up with rheumatology for ongoing workup   Plan/Recommendations: DME referral for decreasing CPAP pressures from 18 down to 60  Encouraged to continue using CPAP on a nightly basis  Graded activities as tolerated  Smoking cessation counseling  Follow-up in 6 months  Encouraged to call with significant concerns  Risks associated with not treating sleep breathing discussed with the patient  Virl Diamond MD  Pulmonary and Critical Care 09/23/2022, 9:01 AM  CC: Roger Kill, *

## 2022-09-24 ENCOUNTER — Ambulatory Visit: Payer: PPO | Attending: Cardiology | Admitting: Cardiology

## 2022-09-24 ENCOUNTER — Encounter: Payer: Self-pay | Admitting: Cardiology

## 2022-09-24 VITALS — BP 142/62 | HR 70 | Ht 70.0 in | Wt 174.8 lb

## 2022-09-24 DIAGNOSIS — Z720 Tobacco use: Secondary | ICD-10-CM | POA: Diagnosis not present

## 2022-09-24 DIAGNOSIS — R002 Palpitations: Secondary | ICD-10-CM | POA: Diagnosis not present

## 2022-09-24 DIAGNOSIS — I10 Essential (primary) hypertension: Secondary | ICD-10-CM

## 2022-09-24 DIAGNOSIS — I25119 Atherosclerotic heart disease of native coronary artery with unspecified angina pectoris: Secondary | ICD-10-CM | POA: Diagnosis not present

## 2022-09-24 DIAGNOSIS — E782 Mixed hyperlipidemia: Secondary | ICD-10-CM

## 2022-09-24 MED ORDER — AMLODIPINE BESYLATE 10 MG PO TABS: 10.0000 mg | ORAL_TABLET | Freq: Every day | ORAL | 3 refills | Status: DC

## 2022-09-24 NOTE — Patient Instructions (Signed)
Medication Instructions:   INCREASE YOUR AMLODIPINE TO 10 MG BY MOUTH DAILY  *If you need a refill on your cardiac medications before your next appointment, please call your pharmacy*    Follow-Up: At [facility], you and your health needs are our priority.  As part of our continuing mission to provide you with exceptional heart care, we have created designated Provider Care Teams.  These Care Teams include your primary Cardiologist (physician) and Advanced Practice Providers (APPs -  Physician Assistants and Nurse Practitioners) who all work together to provide you with the care you need, when you need it.  We recommend signing up for the patient portal called "MyChart".  Sign up information is provided on this After Visit Summary.  MyChart is used to connect with patients for Virtual Visits (Telemedicine).  Patients are able to view lab/test results, encounter notes, upcoming appointments, etc.  Non-urgent messages can be sent to your provider as well.   To learn more about what you can do with MyChart, go to [email or web address].    Your next appointment:   6 month(s)  Provider:   DR. MARK SKAINS

## 2022-09-30 ENCOUNTER — Encounter: Payer: Self-pay | Admitting: Cardiology

## 2022-09-30 DIAGNOSIS — I10 Essential (primary) hypertension: Secondary | ICD-10-CM

## 2022-09-30 DIAGNOSIS — Z79899 Other long term (current) drug therapy: Secondary | ICD-10-CM

## 2022-09-30 MED ORDER — SPIRONOLACTONE 25 MG PO TABS
25.0000 mg | ORAL_TABLET | Freq: Every day | ORAL | 1 refills | Status: DC
Start: 2022-09-30 — End: 2022-10-09

## 2022-09-30 NOTE — Telephone Encounter (Signed)
Sent in spironolactone 25 mg po daily to the pts pharmacy on file and went ahead and made him a lab appt to recheck a bmet (7-10 days) for next Thursday 10/08/22.  Sent the pt a mychart message about the spironolactone that was sent to his pharmacy and that we have him scheduled for lab work (check bmet) with our office for next Thursday 10/08/22.   Advised him to message [place] back if this appt is of any conflict with his schedule.    Also advised him in the message to start monitoring his pressures while on this medication and update Dr. Shari Prows with those readings, via the portal.  Endorsed in the mychart message to reach out with any additional questions/concerns regarding this matter.

## 2022-09-30 NOTE — Telephone Encounter (Signed)
Meriam Sprague, MD  Loa Socks, LPN Looks like he is allergic to an ACE/ARB so will start spiro 25mg  daily. He told me at the visit he was already on HCTZ so we just don't have his most recent list. Will repeat BMET 7-10 days after starting spiro   Left the pt a message to call the office back to endorse medication instructions as indicated above by Dr. Shari Prows.

## 2022-10-01 ENCOUNTER — Other Ambulatory Visit: Payer: Self-pay

## 2022-10-01 DIAGNOSIS — I10 Essential (primary) hypertension: Secondary | ICD-10-CM

## 2022-10-01 DIAGNOSIS — Z79899 Other long term (current) drug therapy: Secondary | ICD-10-CM

## 2022-10-08 ENCOUNTER — Ambulatory Visit: Payer: PPO | Attending: Cardiology

## 2022-10-08 DIAGNOSIS — Z79899 Other long term (current) drug therapy: Secondary | ICD-10-CM

## 2022-10-08 DIAGNOSIS — I10 Essential (primary) hypertension: Secondary | ICD-10-CM

## 2022-10-08 LAB — BASIC METABOLIC PANEL
BUN/Creatinine Ratio: 19 (ref 10–24)
BUN: 24 mg/dL (ref 8–27)
CO2: 25 mmol/L (ref 20–29)
Calcium: 9.7 mg/dL (ref 8.6–10.2)
Chloride: 96 mmol/L (ref 96–106)
Creatinine, Ser: 1.25 mg/dL (ref 0.76–1.27)
Glucose: 255 mg/dL — ABNORMAL HIGH (ref 70–99)
Potassium: 5.5 mmol/L — ABNORMAL HIGH (ref 3.5–5.2)
Sodium: 135 mmol/L (ref 134–144)
eGFR: 62 mL/min/{1.73_m2} (ref >59)

## 2022-10-09 ENCOUNTER — Telehealth: Payer: Self-pay | Admitting: *Deleted

## 2022-10-09 DIAGNOSIS — Z79899 Other long term (current) drug therapy: Secondary | ICD-10-CM

## 2022-10-09 DIAGNOSIS — I10 Essential (primary) hypertension: Secondary | ICD-10-CM

## 2022-10-09 DIAGNOSIS — E875 Hyperkalemia: Secondary | ICD-10-CM

## 2022-10-09 NOTE — Telephone Encounter (Signed)
Loa Socks, LPN 4/0/9811  9:14 PM EDT Back to Top    Called Dr. Shari Prows on the phone about pts elevated K level of 5.5 with new start of spironolactone 25 mg po daily a week ago (on 5/29).   Per Dr. Shari Prows, we will need to stop the pts spironolactone and recheck a BMET on him in one week to reassess K level.   Tried calling the pt to endorse these changes, and he did not answer.  Did leave him a detailed message to call the office back in the morning to receive these results.

## 2022-10-09 NOTE — Telephone Encounter (Signed)
The patient has been notified of the result and verbalized understanding.  All questions (if any) were answered.  Pt aware to stop taking spironolactone now and come back into the office in one week to recheck a bmet.  Pt aware that he can go back to his old BP regimen for now and we will further advise him on BP therapy after his repeat labs come back next week.  Scheduled the pt for repeat BMET in one week on 10/16/22.  Discontinued the pts spironolactone from his med list.   Pt verbalized understanding and agrees with this plan.

## 2022-10-09 NOTE — Addendum Note (Signed)
Addended by: Loa Socks on: 10/09/2022 09:13 AM   Modules accepted: Orders

## 2022-10-16 ENCOUNTER — Ambulatory Visit: Payer: PPO | Attending: Cardiology

## 2022-10-16 DIAGNOSIS — E875 Hyperkalemia: Secondary | ICD-10-CM

## 2022-10-16 DIAGNOSIS — Z79899 Other long term (current) drug therapy: Secondary | ICD-10-CM

## 2022-10-16 DIAGNOSIS — I10 Essential (primary) hypertension: Secondary | ICD-10-CM

## 2022-10-17 LAB — BASIC METABOLIC PANEL
BUN/Creatinine Ratio: 22 (ref 10–24)
BUN: 27 mg/dL (ref 8–27)
CO2: 25 mmol/L (ref 20–29)
Calcium: 9.7 mg/dL (ref 8.6–10.2)
Chloride: 95 mmol/L — ABNORMAL LOW (ref 96–106)
Creatinine, Ser: 1.25 mg/dL (ref 0.76–1.27)
Glucose: 220 mg/dL — ABNORMAL HIGH (ref 70–99)
Potassium: 4.2 mmol/L (ref 3.5–5.2)
Sodium: 136 mmol/L (ref 134–144)
eGFR: 62 mL/min/{1.73_m2} (ref >59)

## 2022-12-05 IMAGING — DX DG HIP (WITH OR WITHOUT PELVIS) 2-3V*R*
3 series · 3 of 3 positions shown · non-contrast
Comparison: Pelvis and femur radiographs 11/08/2016

CLINICAL DATA: Fall onto right hip

EXAM:
DG HIP (WITH OR WITHOUT PELVIS) 2-3V RIGHT; RIGHT FEMUR 2 VIEWS

[pelvis ap]
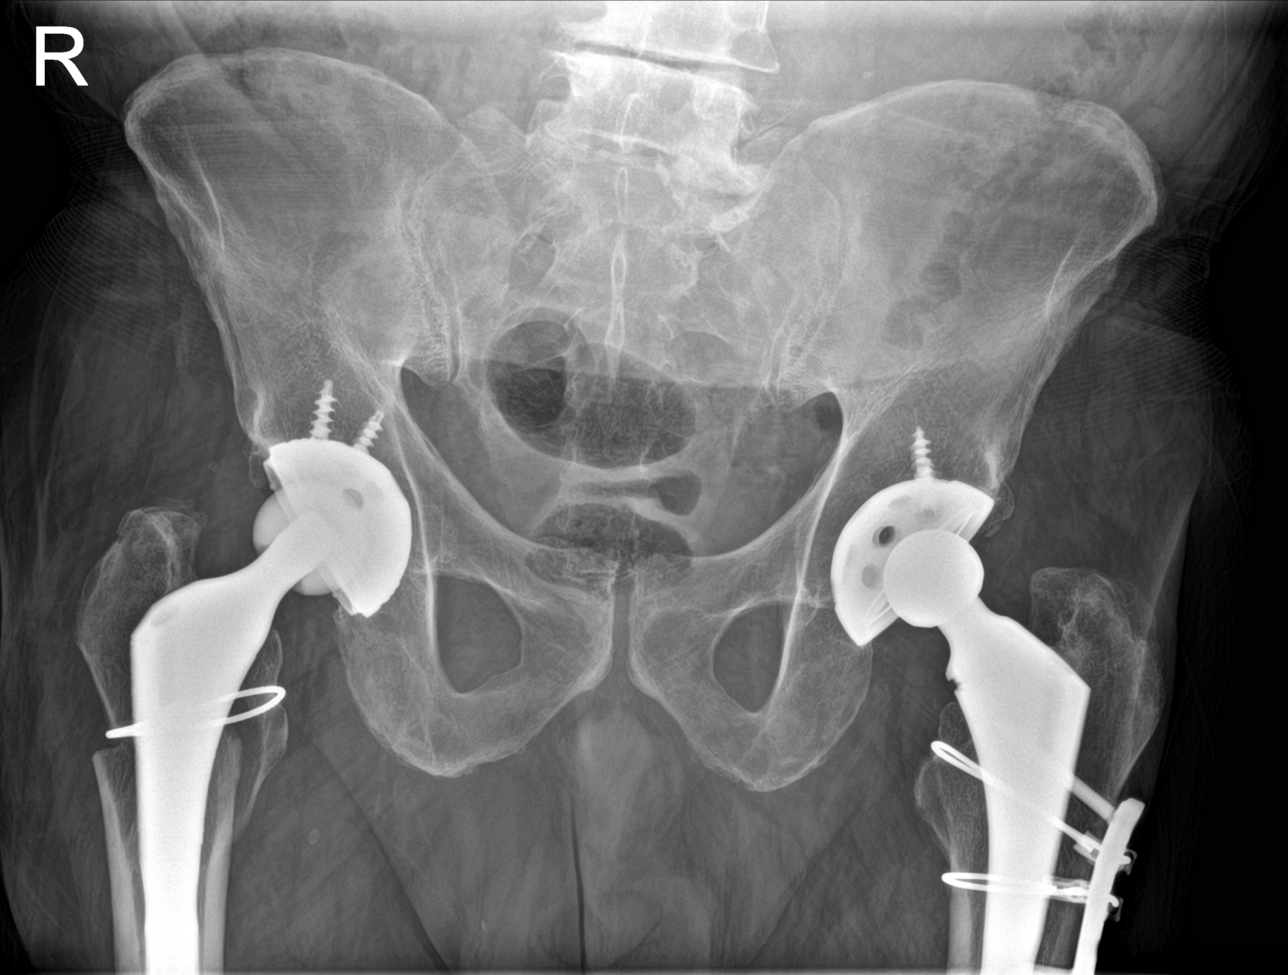

[hip ap]
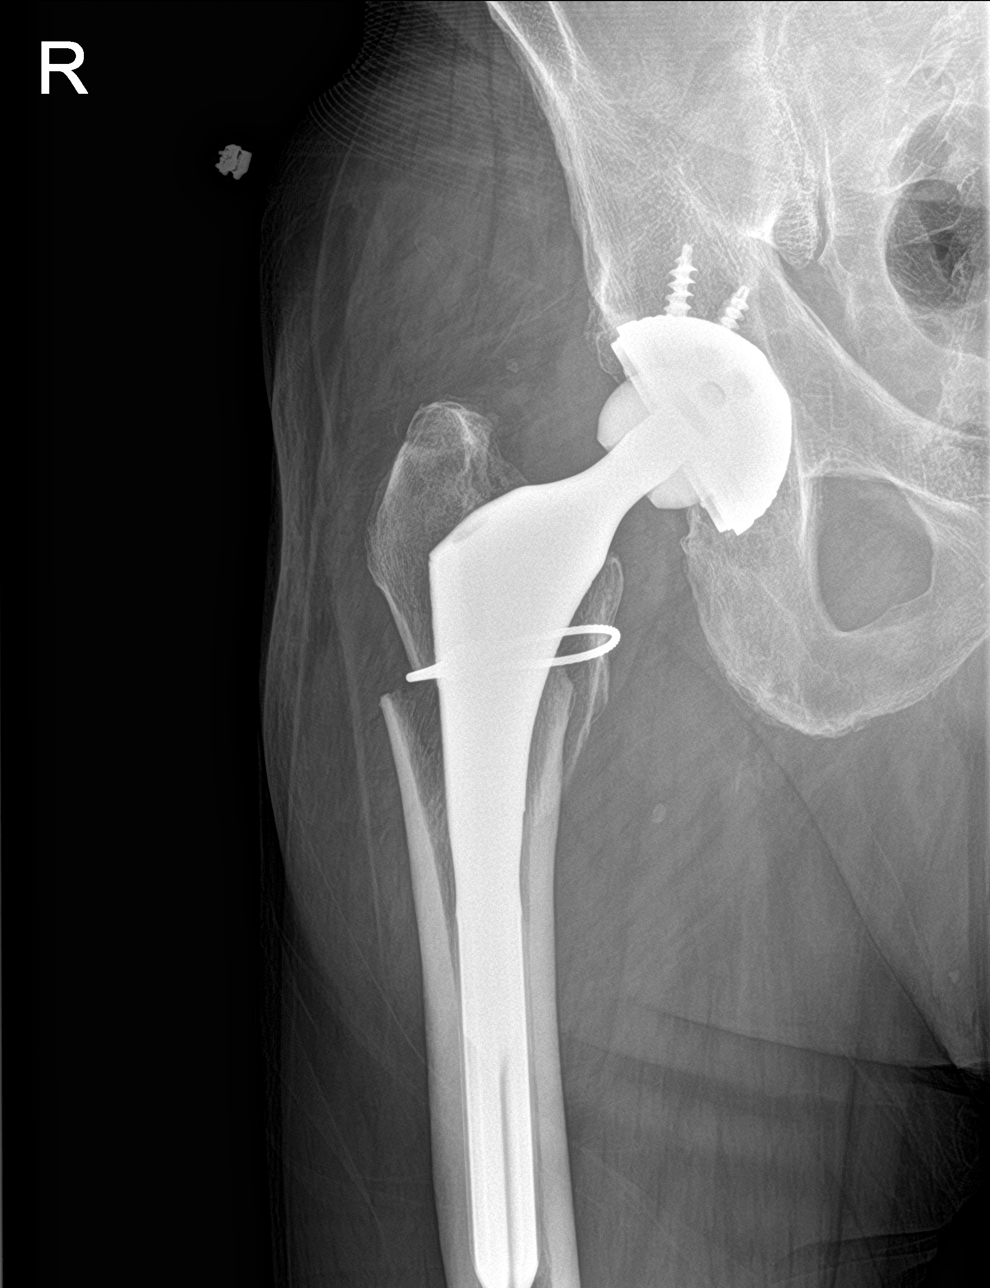

[hip lat]
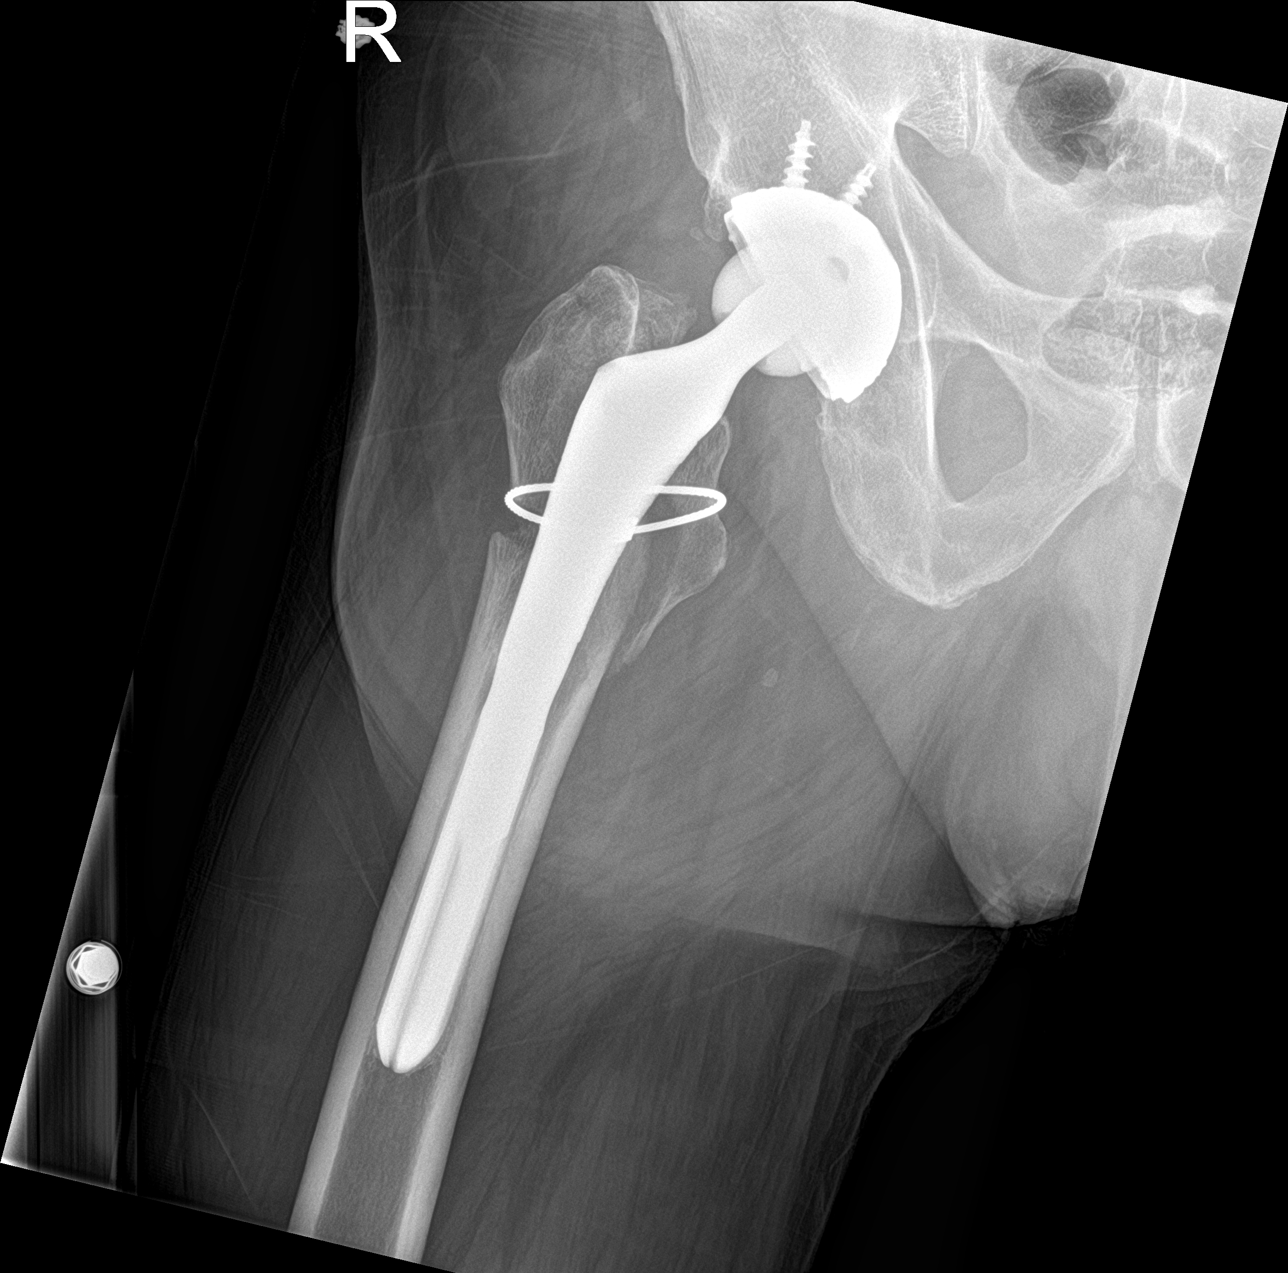

[3 of 3 positions shown; findings below may reference images not displayed]

FINDINGS: Pelvis: The patient is status post bilateral hip arthroplasty. There
is a mildly displaced perihardware fracture of the proximal femur
with a proximally 8 mm lateral displacement of the lateral cortex of
the distal fragment. Femoroacetabular alignment is maintained. There
is no evidence of hardware fracture.

The left arthroplasty hardware appears intact.

Femur: There is no additional fracture. Knee alignment is
maintained. There is moderate to severe degenerative change of the
knee.
IMPRESSION: Mildly displaced perihardware fracture around the right femoral
arthroplasty hardware as above.

## 2023-05-03 ENCOUNTER — Encounter: Payer: Self-pay | Admitting: Pulmonary Disease

## 2023-05-03 ENCOUNTER — Ambulatory Visit: Payer: PPO | Admitting: Pulmonary Disease

## 2023-05-03 VITALS — BP 130/70 | HR 79 | Ht 69.0 in | Wt 173.4 lb

## 2023-05-03 DIAGNOSIS — G4733 Obstructive sleep apnea (adult) (pediatric): Secondary | ICD-10-CM

## 2023-05-03 DIAGNOSIS — I272 Pulmonary hypertension, unspecified: Secondary | ICD-10-CM

## 2023-05-03 DIAGNOSIS — R0602 Shortness of breath: Secondary | ICD-10-CM

## 2023-05-03 MED ORDER — BREZTRI AEROSPHERE 160-9-4.8 MCG/ACT IN AERO
2.0000 | INHALATION_SPRAY | Freq: Two times a day (BID) | RESPIRATORY_TRACT | 3 refills | Status: DC
Start: 2023-05-03 — End: 2023-11-25

## 2023-05-03 MED ORDER — ALBUTEROL SULFATE HFA 108 (90 BASE) MCG/ACT IN AERS: 2.0000 | INHALATION_SPRAY | Freq: Four times a day (QID) | RESPIRATORY_TRACT | 6 refills | Status: AC | PRN

## 2023-05-03 NOTE — Patient Instructions (Addendum)
Schedule for echocardiogram to assess pulmonary pressures  Decrease CPAP pressure from 16 down to 14-DME to decrease pressures  Continue working on quitting smoking  Follow-up in 6 months  Multiple lung nodules stable compared to a year ago, continue with CT monitoring  Call us with significant concerns   Provide sample of Breztri to be used 2 puffs twice a day  Prescription for Markus Daft was sent to pharmacy for you  Refills for albuterol will be placed  Schedule for pulmonary function test

## 2023-05-03 NOTE — Addendum Note (Signed)
Addended by: Carnella Guadalajara on: 05/03/2023 09:26 AM   Modules accepted: Orders

## 2023-05-03 NOTE — Progress Notes (Signed)
Dennis Smith    [id number]    1951/03/29  Primary Care Physician:Williams, Karis Juba, PA-C  Referring Physician: Roger Kill, PA-C [phone]  [place] [address]  Chief complaint:   Patient with moderate obstructive sleep apnea on CPAP therapy in for follow-up  HPI:  Patient with moderate obstructive sleep apnea Has been using CPAP nightly -Still feels pressure is a little bit too much despite decreasing from 18-16 -Will plan on decreasing pressure again to 14 from 16 -Difficulty with exhaling  Recently had a low-dose CT scan of the chest and he was advised about blood vessels being thicker than what they should be  He had had a previous echocardiogram showing borderline pulmonary hypertension -Will plan to repeat echocardiogram  Shortness of breath on exertion  Chronic cough with sputum production -Able to clear secretions easily  CT scan with multiple nodules and evidence of emphysema  Sleeps about 7 hours, wakes up feeling like he is at a good nights rest Feels the pressure may still be a little bit too much for him  Usually goes to bed about 10 PM Falls asleep soon after Wakes up several times during the night Final wake up time between 6 and 6:30 AM Weight has been relatively stable  No family history of obstructive sleep apnea Denies significant dryness, occasionally does have dryness No headaches in the morning Occasional night sweats  Has only ever had gasping respirations months Memory is good Obstructive sleep apnea Family  History of high blood pressure, history of asthma  Active smoker, half pack a day  Outpatient Encounter Medications as of 05/03/2023  Medication Sig   amLODipine (NORVASC) 10 MG tablet Take 1 tablet (10 mg total) by mouth daily.   Ascorbic Acid (VITAMIN C PO) Take 1 tablet by mouth every other day.   aspirin EC 81 MG tablet Take 81 mg by mouth daily. Swallow whole.   atorvastatin  (LIPITOR) 10 MG tablet Take 10 mg by mouth daily. (Patient not taking: Reported on 09/23/2022)   calcium-vitamin D (OSCAL WITH D) 500-200 MG-UNIT tablet Take 1 tablet by mouth.   cetirizine (ZYRTEC) 10 MG tablet Take 10 mg by mouth daily.   Cholecalciferol (VITAMIN D-1000 MAX ST) 25 MCG (1000 UT) tablet Take 1,000 Units by mouth every other day.   CINNAMON PO Take 1 capsule by mouth daily.   Cyanocobalamin (VITAMIN B-12 PO) Take 1 tablet by mouth daily.   metFORMIN (GLUCOPHAGE-XR) 500 MG 24 hr tablet Take 500 mg by mouth every morning.   metoprolol succinate (TOPROL-XL) 50 MG 24 hr tablet Take 75 mg by mouth daily.   Omega-3 Fatty Acids (FISH OIL PO) Take 1 capsule by mouth daily with supper.   omeprazole (PRILOSEC) 20 MG capsule Take 20 mg by mouth daily before breakfast.   predniSONE (DELTASONE) 10 MG tablet Take 10 mg by mouth 2 (two) times daily.   tamsulosin (FLOMAX) 0.4 MG CAPS capsule Take 0.4 mg by mouth daily.   TURMERIC PO Take 1 capsule by mouth daily.   [DISCONTINUED] lisinopril (PRINIVIL,ZESTRIL) 20 MG tablet Take 20 mg by mouth every morning.    No facility-administered encounter medications on file as of 05/03/2023.    Allergies as of 05/03/2023 - Review Complete 09/24/2022  Allergen Reaction Noted   Lisinopril Swelling 04/23/2020    Past Medical History:  Diagnosis Date   Hyperlipidemia    Hypertension     Past Surgical  History:  Procedure Laterality Date   BACK SURGERY     JOINT REPLACEMENT     ORIF FEMUR FRACTURE Left 11/11/2016   Procedure: OPEN REDUCTION INTERNAL FIXATION (ORIF) LEFT FEMUR FRACTURE AND ARTHOTOMY;  Surgeon: Gean Birchwood, MD;  Location: [facility] OR;  Service: Orthopedics;  Laterality: Left;    Family History  Problem Relation Age of Onset   Heart attack Father     Social History   Socioeconomic History   Marital status: Married    Spouse name: Not on file   Number of children: Not on file   Years of education: Not on file   Highest education  level: Not on file  Occupational History   Not on file  Tobacco Use   Smoking status: Every Day    Current packs/day: 1.00    Average packs/day: 1 pack/day for 40.0 years (40.0 ttl pk-yrs)    Types: Cigarettes   Smokeless tobacco: Never  Substance and Sexual Activity   Alcohol use: Yes    Alcohol/week: 8.0 standard drinks of alcohol    Types: 8 Glasses of wine per week   Drug use: No   Sexual activity: Yes  Other Topics Concern   Not on file  Social History Narrative   Not on file   Social Drivers of Health   Financial Resource Strain: Not on file  Food Insecurity: Low Risk  (02/26/2023)   Received from [facility]   Hunger Vital Sign    Worried About Running Out of Food in the Last Year: Never true    Ran Out of Food in the Last Year: Never true  Transportation Needs: No Transportation Needs (02/26/2023)   Received from [facility]    In the past 12 months, has lack of reliable transportation kept you from medical appointments, meetings, work or from getting things needed for daily living? : No  Physical Activity: Not on file  Stress: Not on file  Social Connections: Not on file  Intimate Partner Violence: Not on file    Review of Systems  Respiratory:  Positive for apnea, cough and shortness of breath.   Psychiatric/Behavioral:  Positive for sleep disturbance.     There were no vitals filed for this visit.    Physical Exam Constitutional:      Appearance: Normal appearance.  HENT:     Head: Normocephalic.     Mouth/Throat:     Mouth: Mucous membranes are moist.     Comments: Mallampati two Eyes:     General:        Right eye: No discharge.        Left eye: No discharge.     Pupils: Pupils are equal, round, and reactive to light.  Cardiovascular:     Rate and Rhythm: Normal rate and regular rhythm.     Heart sounds: No murmur heard.    No friction rub.  Pulmonary:     Effort: No respiratory distress.     Breath sounds: No  stridor. No wheezing.  Chest:     Chest wall: No tenderness.  Musculoskeletal:     Cervical back: No rigidity or tenderness.  Neurological:     Mental Status: He is alert.  Psychiatric:        Mood and Affect: Mood normal.       04/29/2022    3:00 PM 04/01/2020    9:00 AM  Results of the Epworth flowsheet  Sitting and reading 0 0  Watching TV  0 2  Sitting, inactive in a public place (e.g. a theatre or a meeting) 0 0  As a passenger in a car for an hour without a break 0 0  Lying down to rest in the afternoon when circumstances permit 3 2  Sitting and talking to someone 0 0  Sitting quietly after a lunch without alcohol 0 0  In a car, while stopped for a few minutes in traffic 0 0  Total score 3 4    Data Reviewed: Referral records reviewed  CPAP compliance reviewed showing excellent compliance with CPAP Using CPAP nightly Average use of 7 hours 47 minutes CPAP pressure of 32 Breztri-year-old AHI of 0.5  Recent CT scan of the chest showing multiple nodules, stable compared to previous CT, evidence of emphysema, bronchial wall thickening   Assessment: Moderate obstructive sleep apnea -Continues to use CPAP nightly -Still feels pressure is too high despite decreasing from 18-16 -Request that pressure be decreased further -Will contact DME company to decrease pressure to 14 from 16  Nicotine dependence Continues to work on quitting smoking  Pulmonary hypertension on previous echocardiogram, was advised that his most recent CT also shows prominent pulmonary vessels -Will repeat echocardiogram to assess pulmonary pressures  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 05/03/2023, 8:25 AM  CC: Roger Kill, *

## 2023-05-18 ENCOUNTER — Ambulatory Visit (HOSPITAL_COMMUNITY): Payer: PPO | Attending: Pulmonary Disease

## 2023-05-18 DIAGNOSIS — R0602 Shortness of breath: Secondary | ICD-10-CM | POA: Diagnosis present

## 2023-05-18 LAB — ECHOCARDIOGRAM COMPLETE
Area-P 1/2: 3.5 cm2
S' Lateral: 2.8 cm

## 2023-06-09 ENCOUNTER — Ambulatory Visit: Payer: PPO | Admitting: Pulmonary Disease

## 2023-06-14 ENCOUNTER — Ambulatory Visit: Payer: PPO | Admitting: Pulmonary Disease

## 2023-06-14 DIAGNOSIS — R0602 Shortness of breath: Secondary | ICD-10-CM

## 2023-06-14 LAB — PULMONARY FUNCTION TEST
DL/VA % pred: 75 %
DL/VA: 3.05 ml/min/mmHg/L
DLCO cor % pred: 73 %
DLCO cor: 18.21 ml/min/mmHg
DLCO unc % pred: 73 %
DLCO unc: 18.21 ml/min/mmHg
FEF 25-75 Post: 1.57 L/s
FEF 25-75 Pre: 0.6 L/s
FEF2575-%Change-Post: 160 %
FEF2575-%Pred-Post: 68 %
FEF2575-%Pred-Pre: 26 %
FEV1-%Change-Post: 55 %
FEV1-%Pred-Post: 66 %
FEV1-%Pred-Pre: 42 %
FEV1-Post: 2.02 L
FEV1-Pre: 1.3 L
FEV1FVC-%Change-Post: 5 %
FEV1FVC-%Pred-Pre: 73 %
FEV6-%Change-Post: 47 %
FEV6-%Pred-Post: 89 %
FEV6-%Pred-Pre: 61 %
FEV6-Post: 3.53 L
FEV6-Pre: 2.4 L
FEV6FVC-%Change-Post: 0 %
FEV6FVC-%Pred-Post: 105 %
FEV6FVC-%Pred-Pre: 106 %
FVC-%Change-Post: 47 %
FVC-%Pred-Post: 84 %
FVC-%Pred-Pre: 57 %
FVC-Post: 3.55 L
FVC-Pre: 2.4 L
Post FEV1/FVC ratio: 57 %
Post FEV6/FVC ratio: 99 %
Pre FEV1/FVC ratio: 54 %
Pre FEV6/FVC Ratio: 100 %
RV % pred: 140 %
RV: 3.43 L
TLC % pred: 99 %
TLC: 6.77 L

## 2023-06-14 NOTE — Patient Instructions (Signed)
 Full PFT performed today.

## 2023-06-14 NOTE — Progress Notes (Signed)
 Full PFT performed today.

## 2023-09-01 HISTORY — PX: EYE SURGERY: SHX253

## 2023-10-21 ENCOUNTER — Ambulatory Visit: Attending: Cardiology | Admitting: Cardiology

## 2023-10-21 VITALS — BP 128/62 | HR 82 | Ht 69.0 in | Wt 172.4 lb

## 2023-10-21 DIAGNOSIS — I10 Essential (primary) hypertension: Secondary | ICD-10-CM

## 2023-10-21 DIAGNOSIS — R002 Palpitations: Secondary | ICD-10-CM

## 2023-10-21 DIAGNOSIS — E875 Hyperkalemia: Secondary | ICD-10-CM

## 2023-10-21 DIAGNOSIS — I25119 Atherosclerotic heart disease of native coronary artery with unspecified angina pectoris: Secondary | ICD-10-CM | POA: Diagnosis not present

## 2023-10-21 DIAGNOSIS — R0609 Other forms of dyspnea: Secondary | ICD-10-CM

## 2023-10-21 DIAGNOSIS — E782 Mixed hyperlipidemia: Secondary | ICD-10-CM

## 2023-10-21 NOTE — Patient Instructions (Signed)

## 2023-10-21 NOTE — Progress Notes (Signed)
 Cardiology Office Note:  .   Date:  10/21/2023  ID:  Dennis Smith, DOB 1950/08/20, MRN [id number] PCP: Aldine Humphreys  [facility] Providers Cardiologist:  Dorothye Gathers, MD     History of Present Illness: .   Dennis Smith is a 73 y.o. male Discussed the use of AI scribe software for clinical note transcription with the patient, who gave verbal consent to proceed.  History of Present Illness Dennis Smith is a 73 year old male with coronary artery calcification who presents for a cardiology follow-up.  He was initially seen in November 2023 for an irregular rhythm detected by his Apple watch. A comprehensive cardiac evaluation was performed, including a nuclear stress test in December 2023, which was normal, and an echocardiogram showing an ejection fraction of 60-65% with grade two diastolic dysfunction but no significant disease. A CT scan revealed coronary artery calcification, prompting further testing. He is currently on atorvastatin 10 mg daily and metoprolol 50 mg daily, which was adjusted from a higher dose due to feelings of sluggishness.  He has a history of irregular heartbeats, particularly after an allergic reaction to seafood, which resolved by the next morning. He has undergone a CT scan for lung cancer screening. He uses a CPAP machine set at 16 for sleep apnea, and recent echocardiogram results showed normal pulmonary pressures and mild mitral and tricuspid valve regurgitation.  He has been diagnosed with polymyalgia rheumatica and experiences 'ups and downs' with symptoms of weakness, soreness, and stiffness. He is currently on prednisone  and has been started on leflunomide to eventually taper off prednisone . He has concerns about leflunomide after a pharmacist consultation but is under close monitoring.  His family history is significant for his father dying of a heart attack at 37 years old, which contributes to his concern about heart health.  No  current chest pain or significant changes in heart-related symptoms. Reports occasional irregular heartbeats, particularly after allergic reactions. He smokes and has been encouraged to quit.      Studies Reviewed: Aaron Aas   EKG Interpretation Date/Time:  Thursday October 21 2023 10:28:20 EDT Ventricular Rate:  82 PR Interval:  146 QRS Duration:  82 QT Interval:  346 QTC Calculation: 404 R Axis:   67  Text Interpretation: Normal sinus rhythm Nonspecific T wave abnormality When compared with ECG of 11-Nov-2016 00:06, No significant change since last tracing Confirmed by Dorothye Gathers ([id number]) on 10/21/2023 10:31:29 AM    Results RADIOLOGY Coronary artery calcification: Calcification present CT scan for lung cancer screening: Elevated pulmonary pressure  DIAGNOSTIC Nuclear stress test: Normal (04/2022) Echocardiogram: EF 60-65%, grade 2 diastolic dysfunction, mild mitral regurgitation, mild tricuspid regurgitation, normal heart size, normal pump function EKG: Normal (10/21/2023) Risk Assessment/Calculations:            Physical Exam:   VS:  BP 128/62 (BP Location: Left Arm, Patient Position: Sitting)   Pulse 82   Ht 5' 9 (1.753 m)   Wt 172 lb 6.4 oz (78.2 kg)   SpO2 94%   BMI 25.46 kg/m    Wt Readings from Last 3 Encounters:  10/21/23 172 lb 6.4 oz (78.2 kg)  05/03/23 173 lb 6.4 oz (78.7 kg)  09/24/22 174 lb 12.8 oz (79.3 kg)    GEN: Well nourished, well developed in no acute distress NECK: No JVD; No carotid bruits CARDIAC: RRR, no murmurs, no rubs, no gallops RESPIRATORY:  Clear to auscultation without rales, wheezing or rhonchi  ABDOMEN: Soft, non-tender,  non-distended EXTREMITIES:  No edema; No deformity   ASSESSMENT AND PLAN: .    Assessment and Plan Assessment & Plan Coronary artery calcification Coronary artery calcification identified on CT scan. Nuclear stress test in December 2023 was normal, indicating no significant coronary artery disease. Condition is  well-managed with atorvastatin and aspirin . - Continue atorvastatin 10 mg daily. - Continue aspirin  81 mg daily for prevention. - Advise to report any chest pain or new symptoms.  Mitral and tricuspid valve regurgitation Echocardiogram revealed mild mitral and tricuspid valve regurgitation. No significant changes or need for surgical intervention. Regular monitoring advised only if symptoms change. - Monitor for changes in symptoms, such as progressive dyspnea. - No routine echocardiogram unless symptoms change.  Hypertension Hypertension is well-controlled with metoprolol and Maxzide (triamterene/hydrochlorothiazide ). Adjusted metoprolol dosage to 50 mg daily due to sluggishness. - Continue metoprolol 50 mg daily. - Continue Maxzide (triamterene/hydrochlorothiazide ) 37.5/25 mg daily.  Polymyalgia rheumatica Polymyalgia rheumatica managed by rheumatologist with prednisone  and leflunomide. Reports variable symptoms, including weakness and stiffness. Pharmacist expressed concern about leflunomide, but under rheumatologist's supervision. - Continue current management with rheumatologist.          Signed, Dorothye Gathers, MD

## 2023-11-01 ENCOUNTER — Other Ambulatory Visit: Payer: Self-pay | Admitting: *Deleted

## 2023-11-01 MED ORDER — AMLODIPINE BESYLATE 10 MG PO TABS: 10.0000 mg | ORAL_TABLET | Freq: Every day | ORAL | 3 refills | Status: AC

## 2023-11-25 ENCOUNTER — Encounter: Payer: Self-pay | Admitting: Pulmonary Disease

## 2023-11-25 ENCOUNTER — Ambulatory Visit: Admitting: Pulmonary Disease

## 2023-11-25 VITALS — BP 134/66 | HR 79 | Ht 69.0 in | Wt 177.2 lb

## 2023-11-25 DIAGNOSIS — J439 Emphysema, unspecified: Secondary | ICD-10-CM | POA: Diagnosis not present

## 2023-11-25 DIAGNOSIS — F17210 Nicotine dependence, cigarettes, uncomplicated: Secondary | ICD-10-CM

## 2023-11-25 DIAGNOSIS — I272 Pulmonary hypertension, unspecified: Secondary | ICD-10-CM

## 2023-11-25 DIAGNOSIS — G4733 Obstructive sleep apnea (adult) (pediatric): Secondary | ICD-10-CM

## 2023-11-25 DIAGNOSIS — J449 Chronic obstructive pulmonary disease, unspecified: Secondary | ICD-10-CM | POA: Diagnosis not present

## 2023-11-25 DIAGNOSIS — R918 Other nonspecific abnormal finding of lung field: Secondary | ICD-10-CM

## 2023-11-25 MED ORDER — BREZTRI AEROSPHERE 160-9-4.8 MCG/ACT IN AERO: 2.0000 | INHALATION_SPRAY | Freq: Two times a day (BID) | RESPIRATORY_TRACT | 3 refills | Status: AC

## 2023-11-25 NOTE — Progress Notes (Signed)
 Dennis Smith    [id number]    Jul 04, 1950  Primary Care Physician:James, Laneta ORN, PA-C  Referring Physician: Trudy Elodia PARAS, PA-C [phone],  [place] [address]  Chief complaint:   Patient with moderate obstructive sleep apnea on CPAP therapy in for follow-up  HPI:  Moderate obstructive sleep apnea Adequately treated with CPAP therapy - We have come down on his CPAP pressures down to 14 at present Tolerating it well - Still wakes up with some chest fullness but otherwise tolerating it okay  Recently had a low-dose CT scan of the chest and he was advised about blood vessels being thicker than what they should be  He had had a previous echocardiogram showing borderline pulmonary hypertension, most recent echocardiogram 05/18/2023 shows normal pressures  Shortness of breath on exertion  Chronic cough with sputum production -Able to clear secretions easily - Clear mucus mostly  CT scan with multiple nodules and evidence of emphysema  Sleeps about 7 hours, wakes up feeling like he is at a good nights rest Feels the pressure may still be a little bit too much for him  Usually goes to bed about 10 PM Falls asleep soon after Wakes up several times during the night Final wake up time between 6 and 6:30 AM Weight has been relatively stable  No family history of obstructive sleep apnea Denies significant dryness, occasionally does have dryness No headaches in the morning Occasional night sweats  Memory is good Obstructive sleep apnea  History of high blood pressure, history of asthma  Active smoker, half pack a day  Outpatient Encounter Medications as of 11/25/2023  Medication Sig   albuterol  (VENTOLIN  HFA) 108 (90 Base) MCG/ACT inhaler Inhale 2 puffs into the lungs every 6 (six) hours as needed for wheezing or shortness of breath.   amLODipine  (NORVASC ) 10 MG tablet Take 1 tablet (10 mg total) by mouth daily.   Ascorbic Acid  (VITAMIN C PO) Take 1 tablet by mouth every other day.   aspirin  EC 81 MG tablet Take 81 mg by mouth daily. Swallow whole.   atorvastatin (LIPITOR) 10 MG tablet Take 10 mg by mouth daily.   calcium carbonate (OS-CAL) 1250 (500 Ca) MG chewable tablet Chew 1 tablet by mouth daily.   calcium-vitamin D (OSCAL WITH D) 500-200 MG-UNIT tablet Take 1 tablet by mouth.   cetirizine (ZYRTEC) 10 MG tablet Take 10 mg by mouth daily.   Cholecalciferol (VITAMIN D-1000 MAX ST) 25 MCG (1000 UT) tablet Take 1,000 Units by mouth every other day.   CINNAMON PO Take 1 capsule by mouth daily.   co-enzyme Q-10 30 MG capsule Take 30 mg by mouth daily.   Coenzyme Q10 (CO Q 10) 100 MG CAPS Take 100 mg by mouth daily at 6 (six) AM.   Cyanocobalamin (VITAMIN B-12 PO) Take 1 tablet by mouth daily.   Glucosamine 500 MG CAPS Take 500 mg by mouth daily at 6 (six) AM.   Magnesium Gluconate 550 MG TABS Take 30 mg by mouth daily at 6 (six) AM.   metFORMIN (GLUCOPHAGE-XR) 500 MG 24 hr tablet Take 500 mg by mouth every morning. 2 in the morning and 2 in the evening   metFORMIN (GLUCOPHAGE-XR) 500 MG 24 hr tablet Take 1,000 mg by mouth 2 (two) times daily with a meal.   metoprolol succinate (TOPROL-XL) 50 MG 24 hr tablet Take 50 mg by mouth daily.   Omega-3 Fatty Acids (  FISH OIL PO) Take 1 capsule by mouth daily with supper.   Omega-3 Fatty Acids (FISH OIL) 1000 MG CAPS Take 1,000 mg by mouth daily at 6 (six) AM.   omeprazole (PRILOSEC) 20 MG capsule Take 20 mg by mouth daily before breakfast.   predniSONE  (DELTASONE ) 1 MG tablet Take 1 mg by mouth daily before breakfast. 4 tablets   tamsulosin (FLOMAX) 0.4 MG CAPS capsule Take 0.4 mg by mouth daily.   triamterene-hydrochlorothiazide  (MAXZIDE-25) 37.5-25 MG tablet Take 1 tablet by mouth daily.   TURMERIC PO Take 1 capsule by mouth daily.   Turmeric, Curcuma Longa, (TURMERIC ROOT) POWD Take 1 capsule by mouth daily at 6 (six) AM.   ascorbic acid (VITAMIN C) 500 MG tablet Take 500  mg by mouth 3 (three) times a week. (Patient not taking: Reported on 11/25/2023)   Budeson-Glycopyrrol-Formoterol (BREZTRI  AEROSPHERE) 160-9-4.8 MCG/ACT AERO Inhale 2 puffs into the lungs in the morning and at bedtime. (Patient not taking: Reported on 11/25/2023)   leflunomide (ARAVA) 20 MG tablet Take 20 mg by mouth daily. (Patient not taking: Reported on 11/25/2023)   predniSONE  (DELTASONE ) 10 MG tablet Take 4 mg by mouth 2 (two) times daily. (Patient not taking: Reported on 11/25/2023)   [DISCONTINUED] lisinopril  (PRINIVIL ,ZESTRIL ) 20 MG tablet Take 20 mg by mouth every morning.    No facility-administered encounter medications on file as of 11/25/2023.    Allergies as of 11/25/2023 - Review Complete 11/25/2023  Allergen Reaction Noted   Ace inhibitors Anaphylaxis 02/26/2023   Lisinopril  Swelling 04/23/2020   Spironolactone  Other (See Comments) 11/13/2022    Past Medical History:  Diagnosis Date   Hyperlipidemia    Hypertension     Past Surgical History:  Procedure Laterality Date   BACK SURGERY     EYE SURGERY Left 09/01/2023   JOINT REPLACEMENT     ORIF FEMUR FRACTURE Left 11/11/2016   Procedure: OPEN REDUCTION INTERNAL FIXATION (ORIF) LEFT FEMUR FRACTURE AND ARTHOTOMY;  Surgeon: Liam Lerner, MD;  Location: [facility] OR;  Service: Orthopedics;  Laterality: Left;    Family History  Problem Relation Age of Onset   Heart attack Father     Social History   Socioeconomic History   Marital status: Married    Spouse name: Not on file   Number of children: Not on file   Years of education: Not on file   Highest education level: Not on file  Occupational History   Not on file  Tobacco Use   Smoking status: Every Day    Current packs/day: 1.00    Average packs/day: 1 pack/day for 40.0 years (40.0 ttl pk-yrs)    Types: Cigarettes   Smokeless tobacco: Never  Substance and Sexual Activity   Alcohol use: Yes    Alcohol/week: 8.0 standard drinks of alcohol    Types: 8 Glasses of  wine per week   Drug use: No   Sexual activity: Yes  Other Topics Concern   Not on file  Social History Narrative   Not on file   Social Drivers of Health   Financial Resource Strain: Not on file  Food Insecurity: Low Risk  (09/16/2023)   Received from [facility]   Hunger Vital Sign    Within the past 12 months, you worried that your food would run out before you got money to buy more: Never true    Within the past 12 months, the food you bought just didn't last and you didn't have money to get more. : Never  true  Transportation Needs: No Transportation Needs (09/16/2023)   Received from [facility]    In the past 12 months, has lack of reliable transportation kept you from medical appointments, meetings, work or from getting things needed for daily living? : No  Physical Activity: Not on file  Stress: Not on file  Social Connections: Not on file  Intimate Partner Violence: Not on file    Review of Systems  Respiratory:  Positive for apnea, cough and shortness of breath.   Psychiatric/Behavioral:  Positive for sleep disturbance.     Vitals:   11/25/23 1558  BP: 134/66  Pulse: 79  SpO2: 94%      Physical Exam Constitutional:      Appearance: Normal appearance.  HENT:     Head: Normocephalic.     Mouth/Throat:     Mouth: Mucous membranes are moist.     Comments: Mallampati two Eyes:     General:        Right eye: No discharge.        Left eye: No discharge.     Pupils: Pupils are equal, round, and reactive to light.  Cardiovascular:     Rate and Rhythm: Normal rate and regular rhythm.     Heart sounds: No murmur heard.    No friction rub.  Pulmonary:     Effort: No respiratory distress.     Breath sounds: No stridor. Rhonchi present. No wheezing.  Chest:     Chest wall: No tenderness.  Musculoskeletal:     Cervical back: No rigidity or tenderness.  Neurological:     Mental Status: He is alert.  Psychiatric:        Mood and Affect:  Mood normal.    Data Reviewed: Referral records reviewed  CPAP compliance reviewed showing excellent compliance with CPAP Average use of 7 hours 32 minutes CPAP of 14 with a residual AHI of 0.5  Recent CT scan of the chest showing multiple nodules, stable compared to previous CT, evidence of emphysema, bronchial wall thickening   Assessment: Moderate obstructive sleep apnea -Currently on a CPAP of 14 - Tolerating it well - Still does have some chest fullness in the mornings - We will maintain the same pressure at present   Nicotine dependence -Continue to work on quitting smoking  Pulmonary hypertension - Most recent echocardiogram shows normal pressures  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

## 2023-11-25 NOTE — Patient Instructions (Signed)
 Continue using your CPAP on a nightly basis  We have sent in refills for your Breztri   Continue to stay active  Call us  with significant concerns  If you develop any significant intolerances to the pressure, we can always go down a little bit more as the download from your machine shows it is working very well at present  I will see you back in 6 months

## 2024-08-24 ENCOUNTER — Ambulatory Visit: Admitting: Pulmonary Disease
